# Patient Record
Sex: Female | Born: 1960 | Race: Black or African American | Hispanic: No | Smoking: Never smoker
Health system: Southern US, Community
[De-identification: ages and names within clinical notes are randomized; demographics above are authoritative.]

## PROBLEM LIST (undated history)

## (undated) DIAGNOSIS — I729 Aneurysm of unspecified site: Secondary | ICD-10-CM

## (undated) DIAGNOSIS — J45909 Unspecified asthma, uncomplicated: Secondary | ICD-10-CM

## (undated) DIAGNOSIS — F329 Major depressive disorder, single episode, unspecified: Secondary | ICD-10-CM

## (undated) DIAGNOSIS — F32A Depression, unspecified: Secondary | ICD-10-CM

## (undated) DIAGNOSIS — E7800 Pure hypercholesterolemia, unspecified: Secondary | ICD-10-CM

## (undated) DIAGNOSIS — I10 Essential (primary) hypertension: Secondary | ICD-10-CM

## (undated) DIAGNOSIS — E119 Type 2 diabetes mellitus without complications: Secondary | ICD-10-CM

## (undated) DIAGNOSIS — F4310 Post-traumatic stress disorder, unspecified: Secondary | ICD-10-CM

## (undated) DIAGNOSIS — I509 Heart failure, unspecified: Secondary | ICD-10-CM

## (undated) HISTORY — PX: SHOULDER SURGERY: SHX246

## (undated) HISTORY — PX: ABDOMINAL HYSTERECTOMY: SHX81

## (undated) HISTORY — PX: TYMPANOSTOMY TUBE PLACEMENT: SHX32

## (undated) HISTORY — PX: TONSILLECTOMY: SUR1361

## (undated) HISTORY — PX: KNEE SURGERY: SHX244

---

## 2013-01-05 ENCOUNTER — Emergency Department (HOSPITAL_BASED_OUTPATIENT_CLINIC_OR_DEPARTMENT_OTHER)
Admission: EM | Admit: 2013-01-05 | Discharge: 2013-01-05 | Disposition: A | Discharge status: Home / Self Care | Payer: Medicaid Other | Attending: Emergency Medicine | Admitting: Emergency Medicine

## 2013-01-05 ENCOUNTER — Emergency Department (HOSPITAL_BASED_OUTPATIENT_CLINIC_OR_DEPARTMENT_OTHER): Payer: Medicaid Other

## 2013-01-05 ENCOUNTER — Encounter (HOSPITAL_BASED_OUTPATIENT_CLINIC_OR_DEPARTMENT_OTHER): Payer: Self-pay | Admitting: *Deleted

## 2013-01-05 DIAGNOSIS — F3289 Other specified depressive episodes: Secondary | ICD-10-CM | POA: Insufficient documentation

## 2013-01-05 DIAGNOSIS — R51 Headache: Secondary | ICD-10-CM | POA: Insufficient documentation

## 2013-01-05 DIAGNOSIS — E7800 Pure hypercholesterolemia, unspecified: Secondary | ICD-10-CM | POA: Insufficient documentation

## 2013-01-05 DIAGNOSIS — R5383 Other fatigue: Secondary | ICD-10-CM | POA: Insufficient documentation

## 2013-01-05 DIAGNOSIS — I509 Heart failure, unspecified: Secondary | ICD-10-CM | POA: Insufficient documentation

## 2013-01-05 DIAGNOSIS — Z7982 Long term (current) use of aspirin: Secondary | ICD-10-CM | POA: Insufficient documentation

## 2013-01-05 DIAGNOSIS — R5381 Other malaise: Secondary | ICD-10-CM | POA: Insufficient documentation

## 2013-01-05 DIAGNOSIS — Z79899 Other long term (current) drug therapy: Secondary | ICD-10-CM | POA: Insufficient documentation

## 2013-01-05 DIAGNOSIS — F329 Major depressive disorder, single episode, unspecified: Secondary | ICD-10-CM | POA: Insufficient documentation

## 2013-01-05 DIAGNOSIS — R509 Fever, unspecified: Secondary | ICD-10-CM

## 2013-01-05 DIAGNOSIS — J45909 Unspecified asthma, uncomplicated: Secondary | ICD-10-CM | POA: Insufficient documentation

## 2013-01-05 DIAGNOSIS — I10 Essential (primary) hypertension: Secondary | ICD-10-CM | POA: Insufficient documentation

## 2013-01-05 HISTORY — DX: Heart failure, unspecified: I50.9

## 2013-01-05 HISTORY — DX: Depression, unspecified: F32.A

## 2013-01-05 HISTORY — DX: Major depressive disorder, single episode, unspecified: F32.9

## 2013-01-05 HISTORY — DX: Pure hypercholesterolemia, unspecified: E78.00

## 2013-01-05 HISTORY — DX: Unspecified asthma, uncomplicated: J45.909

## 2013-01-05 HISTORY — DX: Essential (primary) hypertension: I10

## 2013-01-05 LAB — CBC WITH DIFFERENTIAL/PLATELET
Eosinophils Relative: 2 % (ref 0–5)
HCT: 38.1 % (ref 36.0–46.0)
Hemoglobin: 13.1 g/dL (ref 12.0–15.0)
Lymphocytes Relative: 29 % (ref 12–46)
Lymphs Abs: 1.8 10*3/uL (ref 0.7–4.0)
MCV: 87.2 fL (ref 78.0–100.0)
Monocytes Absolute: 0.6 10*3/uL (ref 0.1–1.0)
Monocytes Relative: 9 % (ref 3–12)
Neutro Abs: 3.6 10*3/uL (ref 1.7–7.7)
RDW: 13.4 % (ref 11.5–15.5)
WBC: 6.1 10*3/uL (ref 4.0–10.5)

## 2013-01-05 LAB — COMPREHENSIVE METABOLIC PANEL
BUN: 3 mg/dL — ABNORMAL LOW (ref 6–23)
CO2: 29 mEq/L (ref 19–32)
Calcium: 9.9 mg/dL (ref 8.4–10.5)
Chloride: 101 mEq/L (ref 96–112)
Creatinine, Ser: 0.8 mg/dL (ref 0.50–1.10)
GFR calc Af Amer: >90 mL/min (ref >90)
GFR calc non Af Amer: 83 mL/min — ABNORMAL LOW (ref >90)
Glucose, Bld: 96 mg/dL (ref 70–99)
Total Bilirubin: 0.2 mg/dL — ABNORMAL LOW (ref 0.3–1.2)

## 2013-01-05 MED ORDER — DOXYCYCLINE HYCLATE 100 MG PO CAPS: 100.0000 mg | ORAL_CAPSULE | Freq: Two times a day (BID) | ORAL | Status: DC

## 2013-01-05 NOTE — ED Provider Notes (Signed)
History     CSN: [id number]  Arrival date & time 01/05/13  1512   First MD Initiated Contact with Patient 01/05/13 1620      Chief Complaint  Patient presents with  . Generalized Body Aches    (Consider location/radiation/quality/duration/timing/severity/associated sxs/prior treatment) Patient is a 52 y.o. female presenting with weakness. The history is provided by the patient.  Weakness This is a new problem. The current episode started 1 to 4 weeks ago. The problem occurs constantly. The problem has been gradually worsening. Associated symptoms include a fever, headaches, myalgias and weakness. Nothing aggravates the symptoms. She has tried nothing for the symptoms.   Pt reports she has had fevers on and off.  Pt has been treated with 2 antibiotics.  Pt reports she still feels bad and has continued to run fevers.   Past Medical History  Diagnosis Date  . CHF (congestive heart failure)   . Hypertension   . Asthma   . Hypercholesteremia   . Depression     Past Surgical History  Procedure Laterality Date  . Abdominal hysterectomy    . Knee surgery    . Shoulder surgery    . Tonsillectomy      History reviewed. No pertinent family history.  History  Substance Use Topics  . Smoking status: Never Smoker   . Smokeless tobacco: Not on file  . Alcohol Use: No    OB History   Grav Para Term Preterm Abortions TAB SAB Ect Mult Living                  Review of Systems  Constitutional: Positive for fever.  Musculoskeletal: Positive for myalgias.  Neurological: Positive for weakness and headaches.  All other systems reviewed and are negative.    Allergies  Review of patient's allergies indicates no known allergies.  Home Medications   Current Outpatient Rx  Name  Route  Sig  Dispense  Refill  . ALPRAZolam (XANAX) 0.25 MG tablet   Oral   Take 0.25 mg by mouth at bedtime as needed for sleep.         Marland Kitchen aspirin 81 MG tablet   Oral   Take 81 mg by mouth  daily.         . cyclobenzaprine (FLEXERIL) 10 MG tablet   Oral   Take 10 mg by mouth daily.         . metoprolol tartrate (LOPRESSOR) 25 MG tablet   Oral   Take by mouth.         . pravastatin (PRAVACHOL) 10 MG tablet   Oral   Take 10 mg by mouth daily.         . QUEtiapine (SEROQUEL) 100 MG tablet   Oral   Take 100 mg by mouth at bedtime.           BP 134/104  Pulse 90  Temp(Src) 98.1 F (36.7 C) (Oral)  Resp 20  Ht 5\' 2"  (1.575 m)  Wt 190 lb (86.183 kg)  BMI 34.74 kg/m2  SpO2 98%  Physical Exam  Nursing note and vitals reviewed. Constitutional: She is oriented to person, place, and time. She appears well-developed and well-nourished.  HENT:  Head: Normocephalic.  Right Ear: External ear normal.  Left Ear: External ear normal.  Eyes: Conjunctivae and EOM are normal. Pupils are equal, round, and reactive to light.  Neck: Normal range of motion.  Cardiovascular: Normal rate and regular rhythm.   Pulmonary/Chest: Effort normal and breath sounds  normal.  Abdominal: She exhibits no distension.  Musculoskeletal: Normal range of motion.  Neurological: She is alert and oriented to person, place, and time.  Skin: Skin is warm.  Psychiatric: She has a normal mood and affect.    ED Course  Procedures (including critical care time)  Labs Reviewed  CBC WITH DIFFERENTIAL - Abnormal; Notable for the following:    Platelets 528 (*)    All other components within normal limits  COMPREHENSIVE METABOLIC PANEL - Abnormal; Notable for the following:    BUN 3 (*)    Total Protein 8.6 (*)    Total Bilirubin 0.2 (*)    GFR calc non Af Amer 83 (*)    All other components within normal limits   Dg Chest 2 View  01/05/2013  *RADIOLOGY REPORT*  Clinical Data: Shortness of breath and weakness.  CHEST - 2 VIEW  Comparison: None  Findings: The cardiomediastinal silhouette is unremarkable. There is no evidence of focal airspace disease, pulmonary edema, suspicious pulmonary  nodule/mass, pleural effusion, or pneumothorax. No acute bony abnormalities are identified.  IMPRESSION: No evidence of active cardiopulmonary disease.   Original Report Authenticated By: Harmon Pier, M.D.      No diagnosis found.    MDM  Labs normal,Chest xray normal.   RMSF ordered.   I advised pt to see her MD for recheck on Monday.  I will treat with doxycycline due to headache and fever,  No menigeal signs       Elson Areas, [place] 01/05/13 1808  Medical screening examination/treatment/procedure(s) were performed by non-physician practitioner and as supervising physician I was immediately available for consultation/collaboration.  Derwood Kaplan, MD 01/08/13 [phone]

## 2013-01-05 NOTE — ED Notes (Signed)
Pt states she has been having s/s x 3 weeks. Seen at [facility]. Dx'd with strep and bilat ear inf and given "Amox 875 x 10 days" On 6th day, began having s/s again and went back. Given 250 mg more as well as Tramadol, but no better.

## 2016-10-09 ENCOUNTER — Encounter (HOSPITAL_BASED_OUTPATIENT_CLINIC_OR_DEPARTMENT_OTHER): Payer: Self-pay | Admitting: Emergency Medicine

## 2016-10-09 ENCOUNTER — Emergency Department (HOSPITAL_BASED_OUTPATIENT_CLINIC_OR_DEPARTMENT_OTHER)
Admission: EM | Admit: 2016-10-09 | Discharge: 2016-10-09 | Disposition: A | Discharge status: Home / Self Care | Payer: Medicaid Other | Attending: Emergency Medicine | Admitting: Emergency Medicine

## 2016-10-09 DIAGNOSIS — K047 Periapical abscess without sinus: Secondary | ICD-10-CM

## 2016-10-09 DIAGNOSIS — H9201 Otalgia, right ear: Secondary | ICD-10-CM | POA: Diagnosis present

## 2016-10-09 DIAGNOSIS — I509 Heart failure, unspecified: Secondary | ICD-10-CM | POA: Diagnosis not present

## 2016-10-09 DIAGNOSIS — Z79899 Other long term (current) drug therapy: Secondary | ICD-10-CM | POA: Insufficient documentation

## 2016-10-09 DIAGNOSIS — J45909 Unspecified asthma, uncomplicated: Secondary | ICD-10-CM | POA: Diagnosis not present

## 2016-10-09 DIAGNOSIS — I110 Hypertensive heart disease with heart failure: Secondary | ICD-10-CM | POA: Insufficient documentation

## 2016-10-09 DIAGNOSIS — Z7982 Long term (current) use of aspirin: Secondary | ICD-10-CM | POA: Diagnosis not present

## 2016-10-09 MED ORDER — PENICILLIN V POTASSIUM 500 MG PO TABS: 500.0000 mg | ORAL_TABLET | Freq: Four times a day (QID) | ORAL | 0 refills | Status: AC

## 2016-10-09 MED ORDER — NAPROXEN 500 MG PO TABS: 500.0000 mg | ORAL_TABLET | Freq: Two times a day (BID) | ORAL | 0 refills | Status: DC

## 2016-10-09 NOTE — Discharge Instructions (Signed)
Return to the ED with any concerns including difficulty breathing or swallowing, vomiting and not able to keep down liquids or medications or any other alarming symptoms

## 2016-10-09 NOTE — ED Triage Notes (Signed)
Treated for ear infection x 10 days ago with poly mix ear gtts and pain worse and also has dental pain. Took Motrin 400mg  PTA

## 2016-10-09 NOTE — ED Provider Notes (Signed)
[facility] Provider [facility]   CSN: [id number] Arrival date & time: 10/09/16  1103     History   Chief Complaint Chief Complaint  Patient presents with  . Otalgia    HPI Sherri Graham is a 56 y.o. female.  HPI  Pt presenting with c/o pain in right ear and right side of mouth and right posterior jaw, pt was treated with abx drops and zpack finished approx 1 week ago for right sided ear infection- she states her tooth and jaw were not hurting at that time.  Then 3 days ago the pain began in her right jaw and behind her ear and in her right lower mouth.  No fever/chills.  No difficulty breathing or swallowing.    Past Medical History:  Diagnosis Date  . Asthma   . CHF (congestive heart failure) (HCC)   . Depression   . Hypercholesteremia   . Hypertension     There are no active problems to display for this patient.   Past Surgical History:  Procedure Laterality Date  . ABDOMINAL HYSTERECTOMY    . KNEE SURGERY    . SHOULDER SURGERY    . TONSILLECTOMY    . TYMPANOSTOMY TUBE PLACEMENT      OB History    No data available       Home Medications    Prior to Admission medications   Medication Sig Start Date End Date Taking? Authorizing Provider  ALPRAZolam Prudy Feeler) 0.25 MG tablet Take 0.25 mg by mouth at bedtime as needed for sleep.   Yes Historical Provider, MD  aspirin 81 MG tablet Take 81 mg by mouth daily.   Yes Historical Provider, MD  cyclobenzaprine (FLEXERIL) 10 MG tablet Take 10 mg by mouth daily.   Yes Historical Provider, MD  metoprolol tartrate (LOPRESSOR) 25 MG tablet Take by mouth.   Yes Historical Provider, MD  pravastatin (PRAVACHOL) 10 MG tablet Take 10 mg by mouth daily.   Yes Historical Provider, MD  QUEtiapine (SEROQUEL) 100 MG tablet Take 100 mg by mouth at bedtime.   Yes Historical Provider, MD  doxycycline (VIBRAMYCIN) 100 MG capsule Take 1 capsule (100 mg total) by mouth 2 (two) times daily. 01/05/13   Elson Areas, PA-C  naproxen  (NAPROSYN) 500 MG tablet Take 1 tablet (500 mg total) by mouth 2 (two) times daily. 10/09/16   Jerelyn Scott, MD  penicillin v potassium (VEETID) 500 MG tablet Take 1 tablet (500 mg total) by mouth 4 (four) times daily. 10/09/16 10/19/16  Jerelyn Scott, MD    Family History No family history on file.  Social History Social History  Substance Use Topics  . Smoking status: Never Smoker  . Smokeless tobacco: Never Used  . Alcohol use No     Allergies   Patient has no known allergies.   Review of Systems Review of Systems  ROS reviewed and all otherwise negative except for mentioned in HPI   Physical Exam Updated Vital Signs BP 130/95 (BP Location: Left Arm)   Pulse 78   Temp 98.3 F (36.8 C) (Oral)   Resp 20   Ht 5\' 3"  (1.6 m)   Wt 180 lb (81.6 kg)   SpO2 100%   BMI 31.89 kg/m  Vitals reviewed Physical Exam Physical Examination: General appearance - alert, well appearing, and in no distress Mental status - alert, oriented to person, place, and time Eyes -no conjunctival injection, no scleral icterus Ears - bilateral external ear canals normal, right TM normal, left  TM with scarring Mouth - mucous membranes moist, pharynx normal without lesions, dental carie in right lower molar, no gingival abscesss, ttp over angle of mandible, tender posterior auricular lymphadenopathy Neck - supple, tender posterior auricular lymphadenopathy Chest - clear to auscultation, no wheezes, rales or rhonchi, symmetric air entry Heart - normal rate, regular rhythm, normal S1, S2, no murmurs, rubs, clicks or gallops Extremities - peripheral pulses normal, no pedal edema, no clubbing or cyanosis Skin - normal coloration and turgor, no rashes  ED Treatments / Results  Labs (all labs ordered are listed, but only abnormal results are displayed) Labs Reviewed - No data to display  EKG  EKG Interpretation None       Radiology No results found.  Procedures Procedures (including critical  care time)  Medications Ordered in ED Medications - No data to display   Initial Impression / Assessment and Plan / ED Course  I have reviewed the triage vital signs and the nursing notes.  Pertinent labs & imaging results that were available during my care of the patient were reviewed by me and considered in my medical decision making (see chart for details).     Pt presenting with pain behind right jaw and right ear, her TM and EAC are normal, some tender LAD behind ear and in cervical chain- she has some dental caries- no obvious abscess. No sore throat, doubt RP abcess or PTA. Suspect source of pain may be coming from dental infection.  Pt started on penicillin. Given rx for naproxen.  Advised to call dentist tomorrow.  Discharged with strict return precautions.  Pt agreeable with plan.  Final Clinical Impressions(s) / ED Diagnoses   Final diagnoses:  Dental infection    New Prescriptions Discharge Medication List as of 10/09/2016  1:22 PM    START taking these medications   Details  naproxen (NAPROSYN) 500 MG tablet Take 1 tablet (500 mg total) by mouth 2 (two) times daily., Starting Sun 10/09/2016, Print    penicillin v potassium (VEETID) 500 MG tablet Take 1 tablet (500 mg total) by mouth 4 (four) times daily., Starting Sun 10/09/2016, Until Wed 10/19/2016, Print         Jerelyn Scott, MD 10/09/16 [phone]

## 2016-11-21 ENCOUNTER — Emergency Department (HOSPITAL_BASED_OUTPATIENT_CLINIC_OR_DEPARTMENT_OTHER): Payer: Medicaid Other

## 2016-11-21 ENCOUNTER — Encounter (HOSPITAL_BASED_OUTPATIENT_CLINIC_OR_DEPARTMENT_OTHER): Payer: Self-pay

## 2016-11-21 ENCOUNTER — Inpatient Hospital Stay (HOSPITAL_BASED_OUTPATIENT_CLINIC_OR_DEPARTMENT_OTHER)
Admission: EM | Admit: 2016-11-21 | Discharge: 2016-11-22 | DRG: 303 | Disposition: A | Discharge status: Home / Self Care | Payer: Medicaid Other | Attending: Cardiology | Admitting: Cardiology

## 2016-11-21 DIAGNOSIS — J45909 Unspecified asthma, uncomplicated: Secondary | ICD-10-CM | POA: Diagnosis present

## 2016-11-21 DIAGNOSIS — I509 Heart failure, unspecified: Secondary | ICD-10-CM | POA: Diagnosis present

## 2016-11-21 DIAGNOSIS — R072 Precordial pain: Secondary | ICD-10-CM

## 2016-11-21 DIAGNOSIS — I10 Essential (primary) hypertension: Secondary | ICD-10-CM | POA: Diagnosis not present

## 2016-11-21 DIAGNOSIS — I252 Old myocardial infarction: Secondary | ICD-10-CM

## 2016-11-21 DIAGNOSIS — R7989 Other specified abnormal findings of blood chemistry: Secondary | ICD-10-CM | POA: Diagnosis present

## 2016-11-21 DIAGNOSIS — I429 Cardiomyopathy, unspecified: Secondary | ICD-10-CM | POA: Diagnosis present

## 2016-11-21 DIAGNOSIS — Z79899 Other long term (current) drug therapy: Secondary | ICD-10-CM | POA: Diagnosis not present

## 2016-11-21 DIAGNOSIS — R0789 Other chest pain: Secondary | ICD-10-CM | POA: Diagnosis not present

## 2016-11-21 DIAGNOSIS — R079 Chest pain, unspecified: Secondary | ICD-10-CM

## 2016-11-21 DIAGNOSIS — Z7982 Long term (current) use of aspirin: Secondary | ICD-10-CM

## 2016-11-21 DIAGNOSIS — F319 Bipolar disorder, unspecified: Secondary | ICD-10-CM | POA: Diagnosis present

## 2016-11-21 DIAGNOSIS — I110 Hypertensive heart disease with heart failure: Secondary | ICD-10-CM | POA: Diagnosis present

## 2016-11-21 DIAGNOSIS — Z880 Allergy status to penicillin: Secondary | ICD-10-CM

## 2016-11-21 DIAGNOSIS — I214 Non-ST elevation (NSTEMI) myocardial infarction: Secondary | ICD-10-CM | POA: Insufficient documentation

## 2016-11-21 DIAGNOSIS — I2510 Atherosclerotic heart disease of native coronary artery without angina pectoris: Secondary | ICD-10-CM | POA: Diagnosis not present

## 2016-11-21 DIAGNOSIS — E785 Hyperlipidemia, unspecified: Secondary | ICD-10-CM | POA: Diagnosis not present

## 2016-11-21 DIAGNOSIS — R778 Other specified abnormalities of plasma proteins: Secondary | ICD-10-CM

## 2016-11-21 DIAGNOSIS — I25119 Atherosclerotic heart disease of native coronary artery with unspecified angina pectoris: Secondary | ICD-10-CM | POA: Diagnosis not present

## 2016-11-21 LAB — CBC WITH DIFFERENTIAL/PLATELET
Basophils Absolute: 0 10*3/uL (ref 0.0–0.1)
Basophils Relative: 0 %
Eosinophils Absolute: 0 10*3/uL (ref 0.0–0.7)
Eosinophils Relative: 0 %
HEMATOCRIT: 41 % (ref 36.0–46.0)
HEMOGLOBIN: 13.9 g/dL (ref 12.0–15.0)
LYMPHS ABS: 1.9 10*3/uL (ref 0.7–4.0)
Lymphocytes Relative: 39 %
MCH: 29.5 pg (ref 26.0–34.0)
MCHC: 33.9 g/dL (ref 30.0–36.0)
MCV: 87 fL (ref 78.0–100.0)
MONO ABS: 0.6 10*3/uL (ref 0.1–1.0)
Monocytes Relative: 12 %
Neutro Abs: 2.3 10*3/uL (ref 1.7–7.7)
Neutrophils Relative %: 49 %
PLATELETS: 311 10*3/uL (ref 150–400)
RBC: 4.71 MIL/uL (ref 3.87–5.11)
RDW: 14.9 % (ref 11.5–15.5)
WBC: 4.8 10*3/uL (ref 4.0–10.5)

## 2016-11-21 LAB — COMPREHENSIVE METABOLIC PANEL
ALT: 12 U/L — ABNORMAL LOW (ref 14–54)
ANION GAP: 11 (ref 5–15)
AST: 16 U/L (ref 15–41)
Albumin: 4.1 g/dL (ref 3.5–5.0)
Alkaline Phosphatase: 98 U/L (ref 38–126)
BUN: 10 mg/dL (ref 6–20)
CO2: 26 mmol/L (ref 22–32)
Calcium: 8.9 mg/dL (ref 8.9–10.3)
Chloride: 101 mmol/L (ref 101–111)
Creatinine, Ser: 0.87 mg/dL (ref 0.44–1.00)
GFR calc Af Amer: >60 mL/min (ref >60)
GFR calc non Af Amer: >60 mL/min (ref >60)
GLUCOSE: 104 mg/dL — AB (ref 65–99)
POTASSIUM: 3.3 mmol/L — AB (ref 3.5–5.1)
Sodium: 138 mmol/L (ref 135–145)
TOTAL PROTEIN: 7.8 g/dL (ref 6.5–8.1)
Total Bilirubin: 0.5 mg/dL (ref 0.3–1.2)

## 2016-11-21 LAB — PROTIME-INR
INR: 0.98
Prothrombin Time: 13 seconds (ref 11.4–15.2)

## 2016-11-21 LAB — HEPARIN LEVEL (UNFRACTIONATED): Heparin Unfractionated: 1.03 IU/mL — ABNORMAL HIGH (ref 0.30–0.70)

## 2016-11-21 LAB — TROPONIN I
TROPONIN I: 0.05 ng/mL — AB (ref <0.03)
Troponin I: <0.03 ng/mL (ref <0.03)

## 2016-11-21 LAB — APTT: aPTT: 29 seconds (ref 24–36)

## 2016-11-21 LAB — D-DIMER, QUANTITATIVE: D-Dimer, Quant: <0.27 ug/mL-FEU (ref 0.00–0.50)

## 2016-11-21 MED ORDER — FENTANYL CITRATE (PF) 100 MCG/2ML IJ SOLN
INTRAMUSCULAR | Status: AC
  Filled 2016-11-21: qty 2

## 2016-11-21 MED ORDER — ALPRAZOLAM 0.25 MG PO TABS: 0.2500 mg | ORAL_TABLET | Freq: Every evening | ORAL | Status: DC | PRN

## 2016-11-21 MED ORDER — NITROGLYCERIN 2 % TD OINT
1.0000 [in_us] | TOPICAL_OINTMENT | Freq: Once | TRANSDERMAL | Status: AC
  Administered 2016-11-21: 1 [in_us] via TOPICAL
  Filled 2016-11-21: qty 1

## 2016-11-21 MED ORDER — HEPARIN (PORCINE) IN NACL 100-0.45 UNIT/ML-% IJ SOLN: 700.0000 [IU]/h | INTRAMUSCULAR | Status: DC

## 2016-11-21 MED ORDER — FENTANYL CITRATE (PF) 100 MCG/2ML IJ SOLN
50.0000 ug | Freq: Once | INTRAMUSCULAR | Status: AC
  Administered 2016-11-21: 50 ug via INTRAVENOUS

## 2016-11-21 MED ORDER — HEPARIN BOLUS VIA INFUSION
4000.0000 [IU] | Freq: Once | INTRAVENOUS | Status: AC
  Administered 2016-11-21: 4000 [IU] via INTRAVENOUS

## 2016-11-21 MED ORDER — PRAVASTATIN SODIUM 20 MG PO TABS
10.0000 mg | ORAL_TABLET | Freq: Every day | ORAL | Status: DC
  Administered 2016-11-21 – 2016-11-22 (×2): 10 mg via ORAL
  Filled 2016-11-21 (×2): qty 1

## 2016-11-21 MED ORDER — QUETIAPINE FUMARATE 50 MG PO TABS
100.0000 mg | ORAL_TABLET | Freq: Every day | ORAL | Status: DC
  Administered 2016-11-21: 100 mg via ORAL
  Filled 2016-11-21: qty 2

## 2016-11-21 MED ORDER — KETOROLAC TROMETHAMINE 30 MG/ML IJ SOLN
30.0000 mg | Freq: Once | INTRAMUSCULAR | Status: AC
  Administered 2016-11-21: 30 mg via INTRAVENOUS
  Filled 2016-11-21: qty 1

## 2016-11-21 MED ORDER — ASPIRIN EC 81 MG PO TBEC
81.0000 mg | DELAYED_RELEASE_TABLET | Freq: Every day | ORAL | Status: DC
  Administered 2016-11-22: 81 mg via ORAL
  Filled 2016-11-21: qty 1

## 2016-11-21 MED ORDER — HEPARIN (PORCINE) IN NACL 100-0.45 UNIT/ML-% IJ SOLN
850.0000 [IU]/h | INTRAMUSCULAR | Status: DC
  Administered 2016-11-21: 850 [IU]/h via INTRAVENOUS
  Filled 2016-11-21: qty 250

## 2016-11-21 MED ORDER — METOPROLOL TARTRATE 25 MG PO TABS
25.0000 mg | ORAL_TABLET | Freq: Two times a day (BID) | ORAL | Status: DC
  Administered 2016-11-21 – 2016-11-22 (×2): 25 mg via ORAL
  Filled 2016-11-21 (×2): qty 1

## 2016-11-21 MED ORDER — ASPIRIN 81 MG PO CHEW
324.0000 mg | CHEWABLE_TABLET | Freq: Once | ORAL | Status: AC
  Administered 2016-11-21: 324 mg via ORAL
  Filled 2016-11-21: qty 4

## 2016-11-21 NOTE — Progress Notes (Signed)
ANTICOAGULATION CONSULT NOTE  Pharmacy Consult for heparin Indication: chest pain/ACS  Heparin Dosing Weight: 69.4 kg   Assessment: 55 yof presenting with CP. Pharmacy consulted to start heparin. Not on anticoagulation PTA. CBC wnl, no bleed documented.  Goal of Therapy:  Heparin level 0.3-0.7 units/ml Monitor platelets by anticoagulation protocol: Yes   Plan:  Heparin 4000 unit bolus Start heparin at 850 units/h 6h heparin level Daily heparin level/CBC Monitor s/sx bleeding   Sherri Graham, PharmD, BCPS Clinical Pharmacist 11/21/2016 2:33 PM

## 2016-11-21 NOTE — Progress Notes (Signed)
ANTICOAGULATION CONSULT NOTE - Follow Up Consult  Pharmacy Consult for heparin Indication: chest pain/ACS  Labs:  Recent Labs  11/21/16 1230 11/21/16 1301 11/21/16 1330 11/21/16 1331 11/21/16 1849 11/21/16 2215  HGB  --  13.9  --   --   --   --   HCT  --  41.0  --   --   --   --   PLT  --  311  --   --   --   --   APTT  --   --   --  29  --   --   LABPROT  --   --   --  13.0  --   --   INR  --   --   --  0.98  --   --   HEPARINUNFRC  --   --   --   --   --  1.03*  CREATININE  --   --  0.87  --   --   --   TROPONINI 0.05*  --   --   --  <0.03  --     Assessment: 56yo female above goal on heparin with initial dosing for CP.  Goal of Therapy:  Heparin level 0.3-0.7 units/ml   Plan:  Will hold heparin gtt x44min then resume at 3 units/kg/hr lower rate of 650 units/hr and check level with closest lab draw ~8hr after resumed.  Vernard Gambles, PharmD, BCPS  11/21/2016,11:25 PM

## 2016-11-21 NOTE — H&P (Signed)
Patient ID: Sherri Graham MRN: [id number], DOB/AGE: 05-22-1961   Admit date: 11/21/2016   Primary Physician: Ferman Hamming, MD Primary Cardiologist: New  Pt. Profile:  56 y/o AA female witih h/o HTN, HLD, asthma, bipolar disorder and reported h/o CHF transferred from [facility] for chest pain evaluation.   Problem List  Past Medical History:  Diagnosis Date  . Asthma   . CHF (congestive heart failure) (HCC)   . Depression   . Hypercholesteremia   . Hypertension     Past Surgical History:  Procedure Laterality Date  . ABDOMINAL HYSTERECTOMY    . KNEE SURGERY    . SHOULDER SURGERY    . TONSILLECTOMY    . TYMPANOSTOMY TUBE PLACEMENT       Allergies  Allergies  Allergen Reactions  . Penicillins     hives    HPI  56 y/o AA female witih h/o HTN, HLD, asthma and reported h/o CHF transferred from [facility] for chest pain evaluation.   She is a poor historian. Per records, she has bipolar disorder and is followed by behavioral medicine and is on multiple psych meds. She was seen at outpatient behavioral health earlier today.   She reports that she has been followed in the past by a cardiologist in [place], Dr. Deno Etienne. She admits that she has not seen him in over 1 year. 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 She reports that she had a cath before in [place]. She is unsure of the exact details but states that she did not get a stent. She is on meds for HTN and HLD and reports full compliance. She denies tobacco use. No h/o DM.  She was in her usual state of health until 3-4 days ago. She developed resting SSCP described as chest tightness/ pressure. Occurred off and on. Also with mild associated dyspnea. She tried to stay in bed most of the day when it first started and avoided exertion. However, given her recurrent pain, she went to her PCP. She was initially felt to have an acute asthma flare and was given steroids and antibiotics,  however no improvement. This prompted her to seek urgent care in [place].    At [facility], initial troponin was abnormal at 0.05. D-dimer negative. EKG shows sinus tach with rate of 106 bpm. K 3.3. Renal function normal with Scr at 0.87. CBC unremarkable. BP has been elevated in the 90s-low 100s diastolic. Nitro patch was placed. Pain improved.   Home Medications  Prior to Admission medications   Medication Sig Start Date End Date Taking? Authorizing Provider  ALPRAZolam Prudy Feeler) 0.25 MG tablet Take 0.25 mg by mouth at bedtime as needed for sleep.   Yes Historical Provider, MD  aspirin 81 MG tablet Take 81 mg by mouth daily.   Yes Historical Provider, MD  cyclobenzaprine (FLEXERIL) 10 MG tablet Take 10 mg by mouth daily.   Yes Historical Provider, MD  metoprolol tartrate (LOPRESSOR) 25 MG tablet Take by mouth.   Yes Historical Provider, MD  pravastatin (PRAVACHOL) 10 MG tablet Take 10 mg by mouth daily.   Yes Historical Provider, MD  QUEtiapine (SEROQUEL) 100 MG tablet Take 100 mg by mouth at bedtime.   Yes Historical Provider, MD  doxycycline (VIBRAMYCIN) 100 MG capsule Take 1 capsule (100 mg total) by mouth 2 (two) times daily. 01/05/13   Elson Areas, PA-C  naproxen (NAPROSYN) 500 MG tablet Take 1 tablet (500  mg total) by mouth 2 (two) times daily. 10/09/16   Jerelyn Scott, MD    Family History  Family History  Problem Relation Age of Onset  . Hypertension Mother     Social History  Social History   Social History  . Marital status: Widowed    Spouse name: N/A  . Number of children: N/A  . Years of education: N/A   Occupational History  . Not on file.   Social History Main Topics  . Smoking status: Never Smoker  . Smokeless tobacco: Never Used  . Alcohol use No  . Drug use: No  . Sexual activity: Not on file   Other Topics Concern  . Not on file   Social History Narrative  . No narrative on file     Review of Systems General:  No chills, fever, night  sweats or weight changes.  Cardiovascular: + chest pain, + dyspnea on exertion, no edema, orthopnea, palpitations, paroxysmal nocturnal dyspnea. Dermatological: No rash, lesions/masses Respiratory: No cough, dyspnea Urologic: No hematuria, dysuria Abdominal:   No nausea, vomiting, diarrhea, bright red blood per rectum, melena, or hematemesis Neurologic:  No visual changes, wkns, changes in mental status. All other systems reviewed and are otherwise negative except as noted above.  Physical Exam  Blood pressure (!) 109/97, pulse 74, temperature 98.4 F (36.9 C), temperature source Oral, resp. rate 16, height 5\' 2"  (1.575 m), weight 85.3 kg (188 lb), SpO2 99 %.  General: Pleasant, NAD Psych: Normal affect. Neuro: Alert and oriented X 3. Moves all extremities spontaneously. HEENT: : [place]/AT, EOMI, MMM, anicteric sclera Neck: Supple without bruits or JVD. Lungs:  Resp regular and unlabored, CTA. Heart: RRR no s3, s4, or murmurs. - focal tenderness bilaterally along the sternal margin (replicates her pain). Abdomen: Soft, non-tender, non-distended, BS + x 4.  Extremities: No clubbing, cyanosis or edema. DP/PT/Radials 2+ and equal bilaterally. Skin: no obvious rash or lesions. No edema  Labs  Troponin (Point of Care Test) No results for input(s): TROPIPOC in the last 72 hours.  Recent Labs  11/21/16 1230 11/21/16 1849  TROPONINI 0.05* <0.03   Lab Results  Component Value Date   WBC 4.8 11/21/2016   HGB 13.9 11/21/2016   HCT 41.0 11/21/2016   MCV 87.0 11/21/2016   PLT 311 11/21/2016     Recent Labs Lab 11/21/16 1330  NA 138  K 3.3*  CL 101  CO2 26  BUN 10  CREATININE 0.87  CALCIUM 8.9  PROT 7.8  BILITOT 0.5  ALKPHOS 98  ALT 12*  AST 16  GLUCOSE 104*   No results found for: CHOL, HDL, LDLCALC, TRIG Lab Results  Component Value Date   DDIMER <0.27 11/21/2016     Radiology/Studies  Dg Chest 2 View  Result Date: 11/21/2016 CLINICAL DATA:  Cough and chest pain  for 5 days. EXAM: CHEST  2 VIEW COMPARISON:  PA and lateral chest 09/13/2015 and 01/05/2013. FINDINGS: The lungs are clear. Heart size is normal. There is no pneumothorax or pleural effusion. No bony abnormality. IMPRESSION: Negative chest. Electronically Signed   By: Drusilla Kanner M.D.   On: 11/21/2016 12:25    ECG  Sinus tach 106 bpm. RAE -- personally reviewed   ASSESSMENT AND PLAN  1. Chest Pain with + Troponin: Pt is currently CP free. She notes cardiac history which includes h/o MI and CHF, treated at [facility] over 10 year ago. She notes prior cath but did not get a stent. Unfortunately, no  cath report or CAD anatomy is outlined in records. Initial troponin abnormal at 0.05. EKG shows mild sinus tach at 106 bpm. D-dimer negative, ruling out PE. We will plan to continue to cycle cardiac enzymes x 3. Continue IV heparin. NPO at midnight. Plan for ischemic evaluation in the am, either NST or LHC, depending on enzyme trend. If flat low level trend, plan stress test and 2D echo.   2. Bipolar Disorder: continue outpatient meds.   3. HLD: pt on pravastatin as an outpatient. Check FLP in the am.   4. HTN: Diastolic BPs have been elevated. Continue home metoprolol and continue to monitor. She may require a 2nd agent for better control.    SignedBryan Lemma, PA-C 11/21/2016, 8:37 PM   I have seen, examined and evaluated the patient this PM along with Boyce Medici, PA-C.  After reviewing all the available data and chart, we discussed the patients laboratory, study & physical findings as well as symptoms in detail. I agree with her findings, examination as well as impression recommendations as per our discussion.    Patient has a very confusing potential history. She did have evidence of troponin elevation several years ago, and was told that.stent. Something about an artery branching off. My suspicion is that it was probably that it was probably fed by collaterals. She does not  recall ever being told that she had a reduced pump function, although she was told she had congestive heart failure. This does not help much. -- We will need to get records from [facility] where she was seen by cardiologist there. On my exam the most notable thing was that the discomfort in her chest was somewhat reproducible on exam along both sternal margins. Probably more concerning for musculoskeletal costochondritis pain in the setting of recent bronchitis exacerbation.  The chest discomfort she is presenting with now even though there is a mild troponin elevation it sounds very atypical and most consistent with musculoskeletal chest pain. Given her history is at least moderate risk and therefore think is reasonable to consider stress test tomorrow.  We'll continue to cycle cardiac enzymes and use heparin until we see that there is indeed no evidence of MI. An echo will also help [place] evaluate her "heart failure" diagnosis.  - BB & Statin.  Continue Seroquel. Check A1c    Bryan Lemma, M.D., M.S. Interventional Cardiologist   Pager # [phone] Phone # [phone] [address] [place], [place] [address]

## 2016-11-21 NOTE — ED Triage Notes (Addendum)
PT reports that last Wednesday she was placed on Zpak, Steroids for asthma flare. Reports worsening of symptoms, and chest pain that worsened Saturday night, denies fever. Reports symptoms are exacerbating her CHF. Has completed Zpak. Continues PRN Tessalon.

## 2016-11-21 NOTE — ED Notes (Signed)
Watching TV, pleasant. Given lunch tray and sprite. States," I am feeling better"

## 2016-11-21 NOTE — ED Provider Notes (Signed)
Emergency Department Provider Note   I have reviewed the triage vital signs and the nursing notes.   HISTORY  Chief Complaint Chest Pain   HPI Sherri Graham is a 56 y.o. female with PMH of CHF, HLD, HTN resents to the emergency department for evaluation of chest discomfort for the past 2 days. Patient states that she's had coronary artery disease in the past but no recent heart catheterization or stress test. Several years ago she followed with a cardiologist in [place] but has not recently. Over the past 2 days she's had intermittent central chest pressure that is nonradiating. She has some very mild dyspnea. Symptoms are worse with inspiration but otherwise are no modifying factors. Her symptoms became more constant over the past 12 hours and so she presented to the emergency department. At this point she is having mild chest discomfort. She has been compliant with her medications. No recent infection symptoms.   Past Medical History:  Diagnosis Date  . Asthma   . CHF (congestive heart failure) (HCC)   . Depression   . Hypercholesteremia   . Hypertension     Patient Active Problem List   Diagnosis Date Noted  . NSTEMI (non-ST elevated myocardial infarction) (HCC) 11/21/2016  . Chest pain with moderate risk for cardiac etiology 11/21/2016    Past Surgical History:  Procedure Laterality Date  . ABDOMINAL HYSTERECTOMY    . KNEE SURGERY    . SHOULDER SURGERY    . TONSILLECTOMY    . TYMPANOSTOMY TUBE PLACEMENT        Allergies Penicillins  Family History  Problem Relation Age of Onset  . Hypertension Mother     Social History Social History  Substance Use Topics  . Smoking status: Never Smoker  . Smokeless tobacco: Never Used  . Alcohol use No    Review of Systems  Constitutional: No fever/chills Eyes: No visual changes. ENT: No sore throat. Cardiovascular: Positive chest pain. Respiratory: Denies shortness of breath. Gastrointestinal: No abdominal  pain.  No nausea, no vomiting.  No diarrhea.  No constipation. Genitourinary: Negative for dysuria. Musculoskeletal: Negative for back pain. Skin: Negative for rash. Neurological: Negative for headaches, focal weakness or numbness.  10-point ROS otherwise negative.  ____________________________________________   PHYSICAL EXAM:  VITAL SIGNS: ED Triage Vitals  Enc Vitals Group     BP 11/21/16 1146 115/65     Pulse Rate 11/21/16 1146 (!) 106     Resp 11/21/16 1146 18     Temp 11/21/16 1146 97.6 F (36.4 C)     Temp Source 11/21/16 1146 Oral     SpO2 11/21/16 1146 100 %     Weight 11/21/16 1146 188 lb (85.3 kg)     Height 11/21/16 1146 5\' 2"  (1.575 m)     Pain Score 11/21/16 1142 9   Constitutional: Alert and oriented. Well appearing and in no acute distress. Eyes: Conjunctivae are normal. Head: Atraumatic. Nose: No congestion/rhinnorhea. Mouth/Throat: Mucous membranes are moist.  Neck: No stridor.   Cardiovascular: Sinus tachycardia. Good peripheral circulation. Grossly normal heart sounds.   Respiratory: Normal respiratory effort.  No retractions. Lungs CTAB. Gastrointestinal: Soft and nontender. No distention.  Musculoskeletal: No lower extremity tenderness nor edema. No gross deformities of extremities. Neurologic:  Normal speech and language. No gross focal neurologic deficits are appreciated.  Skin:  Skin is warm, dry and intact. No rash noted.   ____________________________________________   LABS (all labs ordered are listed, but only abnormal results are displayed)  Labs  Reviewed  TROPONIN I - Abnormal; Notable for the following:       Result Value   Troponin I 0.05 (*)    All other components within normal limits  COMPREHENSIVE METABOLIC PANEL - Abnormal; Notable for the following:    Potassium 3.3 (*)    Glucose, Bld 104 (*)    ALT 12 (*)    All other components within normal limits  LIPID PANEL - Abnormal; Notable for the following:    HDL 40 (*)     All other components within normal limits  HEPARIN LEVEL (UNFRACTIONATED) - Abnormal; Notable for the following:    Heparin Unfractionated 1.03 (*)    All other components within normal limits  CBC WITH DIFFERENTIAL/PLATELET  D-DIMER, QUANTITATIVE (NOT AT [facility])  PROTIME-INR  APTT  TROPONIN I  TROPONIN I  CBC WITH DIFFERENTIAL/PLATELET  CBC  TROPONIN I  BASIC METABOLIC PANEL  CBC  HEPARIN LEVEL (UNFRACTIONATED)   ____________________________________________  EKG   EKG Interpretation  Date/Time:  Monday November 21 2016 11:47:36 EDT Ventricular Rate:  106 PR Interval:  116 QRS Duration: 104 QT Interval:  354 QTC Calculation: 470 R Axis:   78 Text Interpretation:  Sinus tachycardia Right atrial enlargement Nonspecific ST and T wave abnormality Abnormal ECG No STEMI. No prior for comparison.  Confirmed by Thoams Siefert MD, Ijanae Macapagal [phone]) on 11/21/2016 12:10:10 PM       ____________________________________________  RADIOLOGY  Dg Chest 2 View  Result Date: 11/21/2016 CLINICAL DATA:  Cough and chest pain for 5 days. EXAM: CHEST  2 VIEW COMPARISON:  PA and lateral chest 09/13/2015 and 01/05/2013. FINDINGS: The lungs are clear. Heart size is normal. There is no pneumothorax or pleural effusion. No bony abnormality. IMPRESSION: Negative chest. Electronically Signed   By: Drusilla Kanner M.D.   On: 11/21/2016 12:25    ____________________________________________   PROCEDURES  Procedure(s) performed:   Procedures  None ____________________________________________   INITIAL IMPRESSION / ASSESSMENT AND PLAN / ED COURSE  Pertinent labs & imaging results that were available during my care of the patient were reviewed by me and considered in my medical decision making (see chart for details).  Patient with past medical history of coronary artery disease, hyperlipidemia, CHF presents with chest pain over the past 2 days is become more constant over the past 12 hours. Her chest pain is  described as a pressure tightness in the center of her chest no modifying factors. Her initial labs from triage are significant for mild elevation in her troponin. D-dimer obtained with some possible pleuritic quality to the pain and tachycardia on arrival but this is normal. Chest x-ray is unremarkable. She does not follow with a cardiologist regularly. She reports a heart catheterization in 2013 but no stents were placed. No stress test in the past 3-4 years. Than her multiple medical comorbidities, chest pain, elevated troponin of plan for observational admission for further biomarker trending and consideration of provocative testing. We will discuss the case with cardiology given the elevated troponin.   Discussed patient's case with Cardiology, Dr. [place].  Recommend admission to obs, telemetry bed.  I will place holding orders per their request. Patient and family (if present) updated with plan. Care transferred to Cardiology service.  I reviewed all nursing notes, vitals, pertinent old records, EKGs, labs, imaging (as available).  ____________________________________________  FINAL CLINICAL IMPRESSION(S) / ED DIAGNOSES  Final diagnoses:  Precordial chest pain  Elevated troponin     MEDICATIONS GIVEN DURING THIS VISIT:  Medications  ALPRAZolam (XANAX) tablet 0.25 mg (not administered)  aspirin EC tablet 81 mg (not administered)  pravastatin (PRAVACHOL) tablet 10 mg (10 mg Oral Given 11/21/16 2026)  metoprolol tartrate (LOPRESSOR) tablet 25 mg (25 mg Oral Given 11/21/16 2110)  QUEtiapine (SEROQUEL) tablet 100 mg (100 mg Oral Given 11/21/16 2110)  heparin ADULT infusion 100 units/mL (25000 units/236mL sodium chloride 0.45%) (650 Units/hr Intravenous Rate/Dose Verify 11/22/16 0800)  aspirin chewable tablet 324 mg (324 mg Oral Given 11/21/16 1416)  nitroGLYCERIN (NITROGLYN) 2 % ointment 1 inch (1 inch Topical Given 11/21/16 1415)  heparin bolus via infusion 4,000 Units (4,000 Units Intravenous  Bolus from Bag 11/21/16 1452)  ketorolac (TORADOL) 30 MG/ML injection 30 mg (30 mg Intravenous Given 11/21/16 1447)  fentaNYL (SUBLIMAZE) injection 50 mcg (50 mcg Intravenous Given 11/21/16 1548)  technetium tetrofosmin (TC-MYOVIEW) injection 10 millicurie (10 millicuries Intravenous Contrast Given 11/22/16 0836)  regadenoson (LEXISCAN) injection SOLN 0.4 mg (0.4 mg Intravenous Given 11/22/16 0929)     NEW OUTPATIENT MEDICATIONS STARTED DURING THIS VISIT:  None   Note:  This document was prepared using Dragon voice recognition software and may include unintentional dictation errors.  Alona Bene, MD Emergency Medicine   Maia Plan, MD 11/22/16 [phone]

## 2016-11-21 NOTE — ED Notes (Signed)
Patient transported to X-ray 

## 2016-11-21 NOTE — ED Notes (Signed)
Report given to Arlice Colt, receiving nurse at [facility]

## 2016-11-22 ENCOUNTER — Inpatient Hospital Stay (HOSPITAL_COMMUNITY): Payer: Medicaid Other

## 2016-11-22 DIAGNOSIS — I10 Essential (primary) hypertension: Secondary | ICD-10-CM

## 2016-11-22 DIAGNOSIS — I429 Cardiomyopathy, unspecified: Secondary | ICD-10-CM

## 2016-11-22 DIAGNOSIS — R079 Chest pain, unspecified: Secondary | ICD-10-CM

## 2016-11-22 DIAGNOSIS — R0789 Other chest pain: Secondary | ICD-10-CM

## 2016-11-22 LAB — LIPID PANEL
CHOL/HDL RATIO: 4 ratio
CHOLESTEROL: 158 mg/dL (ref 0–200)
HDL: 40 mg/dL — AB (ref >40)
LDL Cholesterol: 97 mg/dL (ref 0–99)
TRIGLYCERIDES: 103 mg/dL (ref <150)
VLDL: 21 mg/dL (ref 0–40)

## 2016-11-22 LAB — NM MYOCAR MULTI W/SPECT W/WALL MOTION / EF
Estimated workload: 1 METS
Exercise duration (min): 5 min
Exercise duration (sec): 18 s
LVDIAVOL: 84 mL (ref 46–106)
LVSYSVOL: 53 mL
MPHR: 164 {beats}/min
NUC STRESS TID: 0.94
Peak HR: 96 {beats}/min
RATE: 0
Rest HR: 74 {beats}/min
SDS: 0
SRS: 13
SSS: 13

## 2016-11-22 LAB — CBC
HEMATOCRIT: 40.9 % (ref 36.0–46.0)
HEMOGLOBIN: 13.5 g/dL (ref 12.0–15.0)
MCH: 29.3 pg (ref 26.0–34.0)
MCHC: 33 g/dL (ref 30.0–36.0)
MCV: 88.9 fL (ref 78.0–100.0)
Platelets: 314 10*3/uL (ref 150–400)
RBC: 4.6 MIL/uL (ref 3.87–5.11)
RDW: 15 % (ref 11.5–15.5)
WBC: 6 10*3/uL (ref 4.0–10.5)

## 2016-11-22 LAB — BASIC METABOLIC PANEL
Anion gap: 13 (ref 5–15)
BUN: 15 mg/dL (ref 6–20)
CALCIUM: 8.6 mg/dL — AB (ref 8.9–10.3)
CO2: 22 mmol/L (ref 22–32)
Chloride: 104 mmol/L (ref 101–111)
Creatinine, Ser: 0.88 mg/dL (ref 0.44–1.00)
GFR calc Af Amer: >60 mL/min (ref >60)
GFR calc non Af Amer: >60 mL/min (ref >60)
Glucose, Bld: 130 mg/dL — ABNORMAL HIGH (ref 65–99)
POTASSIUM: 3.9 mmol/L (ref 3.5–5.1)
Sodium: 139 mmol/L (ref 135–145)

## 2016-11-22 LAB — ECHOCARDIOGRAM COMPLETE
Height: 62 in
WEIGHTICAEL: 2840 [oz_av]

## 2016-11-22 LAB — TROPONIN I
Troponin I: <0.03 ng/mL (ref <0.03)
Troponin I: <0.03 ng/mL (ref <0.03)

## 2016-11-22 LAB — HEPARIN LEVEL (UNFRACTIONATED): Heparin Unfractionated: 0.3 IU/mL (ref 0.30–0.70)

## 2016-11-22 MED ORDER — TECHNETIUM TC 99M TETROFOSMIN IV KIT
10.0000 | PACK | Freq: Once | INTRAVENOUS | Status: AC | PRN
  Administered 2016-11-22: 10 via INTRAVENOUS

## 2016-11-22 MED ORDER — TECHNETIUM TC 99M TETROFOSMIN IV KIT
30.0000 | PACK | Freq: Once | INTRAVENOUS | Status: AC | PRN
  Administered 2016-11-22: 30 via INTRAVENOUS

## 2016-11-22 MED ORDER — REGADENOSON 0.4 MG/5ML IV SOLN
INTRAVENOUS | Status: AC
  Administered 2016-11-22: 0.4 mg via INTRAVENOUS
  Filled 2016-11-22: qty 5

## 2016-11-22 MED ORDER — REGADENOSON 0.4 MG/5ML IV SOLN
0.4000 mg | Freq: Once | INTRAVENOUS | Status: AC
  Administered 2016-11-22: 0.4 mg via INTRAVENOUS
  Filled 2016-11-22: qty 5

## 2016-11-22 NOTE — Progress Notes (Signed)
   Patient presented for [facility]. Tolerated procedure well. Pending final stress imaging result.  Berton Bon, AGNP-C 11/22/2016  10:14 AM Pager: [phone]

## 2016-11-22 NOTE — Progress Notes (Signed)
  Echocardiogram 2D Echocardiogram has been performed.  Sherri Graham 11/22/2016, 4:25 PM

## 2016-11-22 NOTE — Progress Notes (Signed)
ANTICOAGULATION CONSULT NOTE - Follow Up Consult  Pharmacy Consult for heparin Indication: chest pain/ACS  Labs:  Recent Labs  11/21/16 1301 11/21/16 1330 11/21/16 1331 11/21/16 1849 11/21/16 2215 11/22/16 0224 11/22/16 1035  HGB 13.9  --   --   --   --   --  13.5  HCT 41.0  --   --   --   --   --  40.9  PLT 311  --   --   --   --   --  314  APTT  --   --  29  --   --   --   --   LABPROT  --   --  13.0  --   --   --   --   INR  --   --  0.98  --   --   --   --   HEPARINUNFRC  --   --   --   --  1.03*  --  0.30  CREATININE  --  0.87  --   --   --   --  0.88  TROPONINI  --   --   --  <0.03  --  <0.03 <0.03    Assessment: 56yo female now at goal on heparin with initial dosing for CP.  No bleeding issues noted. S/p lexiscan today, results pending. CBC stable.   Goal of Therapy:  Heparin level 0.3-0.7 units/ml   Plan:  Increase heparin to 700 units/hr Daily heparin level and cbc  Sheppard Coil PharmD., BCPS Clinical Pharmacist Pager [phone] 11/22/2016 2:31 PM

## 2016-11-22 NOTE — Progress Notes (Signed)
Progress Note  Patient Name: Sherri Graham Date of Encounter: 11/22/2016  Primary Cardiologist: New- Dr. Herbie Baltimore  Subjective   Pt feeling well this morning. She had intermittent chest discomfort yesterday, none since midnight. I am seeing the patient in stress lab.  Inpatient Medications    Scheduled Meds: . aspirin EC  81 mg Oral Daily  . metoprolol tartrate  25 mg Oral BID  . pravastatin  10 mg Oral q1800  . QUEtiapine  100 mg Oral QHS   Continuous Infusions: . heparin 650 Units/hr (11/22/16 0800)   PRN Meds: ALPRAZolam   Vital Signs    Vitals:   11/22/16 0549 11/22/16 0800 11/22/16 0805 11/22/16 0908  BP: 104/83 93/78 100/80 (!) 154/71  Pulse: 73 72 77   Resp: 18     Temp: 97.6 F (36.4 C)     TempSrc: Oral     SpO2: 98% 98% 97%   Weight: 177 lb 8 oz (80.5 kg)     Height:        Intake/Output Summary (Last 24 hours) at 11/22/16 0932 Last data filed at 11/22/16 0827  Gross per 24 hour  Intake            454.5 ml  Output              250 ml  Net            204.5 ml   Filed Weights   11/21/16 1146 11/22/16 0549  Weight: 188 lb (85.3 kg) 177 lb 8 oz (80.5 kg)    Telemetry    NSR  ECG    11/21/16 Sinus tach 106 bpm. RAE  Physical Exam   GEN: No acute distress.   Neck: No JVD Cardiac: RRR, no murmurs, rubs, or gallops.  Respiratory: Clear to auscultation bilaterally. GI: Soft, nontender, non-distended  MS: No edema; No deformity. Neuro:  Nonfocal  Psych: Normal affect   Labs    Chemistry Recent Labs Lab 11/21/16 1330  NA 138  K 3.3*  CL 101  CO2 26  GLUCOSE 104*  BUN 10  CREATININE 0.87  CALCIUM 8.9  PROT 7.8  ALBUMIN 4.1  AST 16  ALT 12*  ALKPHOS 98  BILITOT 0.5  GFRNONAA >60  GFRAA >60  ANIONGAP 11     Hematology Recent Labs Lab 11/21/16 1301  WBC 4.8  RBC 4.71  HGB 13.9  HCT 41.0  MCV 87.0  MCH 29.5  MCHC 33.9  RDW 14.9  PLT 311    Cardiac Enzymes Recent Labs Lab 11/21/16 1230 11/21/16 1849  11/22/16 0224  TROPONINI 0.05* <0.03 <0.03   No results for input(s): TROPIPOC in the last 168 hours.   BNPNo results for input(s): BNP, PROBNP in the last 168 hours.   DDimer  Recent Labs Lab 11/21/16 1330  DDIMER <0.27     Radiology    Dg Chest 2 View  Result Date: 11/21/2016 CLINICAL DATA:  Cough and chest pain for 5 days. EXAM: CHEST  2 VIEW COMPARISON:  PA and lateral chest 09/13/2015 and 01/05/2013. FINDINGS: The lungs are clear. Heart size is normal. There is no pneumothorax or pleural effusion. No bony abnormality. IMPRESSION: Negative chest. Electronically Signed   By: Drusilla Kanner M.D.   On: 11/21/2016 12:25    Cardiac Studies   Myoview today: Study Result     There was no ST segment deviation noted during stress.  No T wave inversion was noted during stress.  This is an intermediate  risk study.  The left ventricular ejection fraction is moderately decreased (30-44%).  Nuclear stress EF: 36%.   1. EF 36% with diffuse hypokinesis.  Visually does not look as bad, would get an echocardiogram.  2. There is significant subdiaphragmatic activity at rest and with stress that may be a source of artifact.  3. There is a fixed, medium-sized, moderate intensity apical anterior, apical septal, and true apex perfusion defect.  No evidence for ischemia.   Difficult study.  Intermediate risk because of low EF.  There does not appear to be ischemia.  Hard to tell if the fixed defect is due to prior MI or artifact.  EF may actually be better than calculated, need echo.    Echo Today: Mild global reduction in LV function; grade 1 diastolic dysfunction; sclerotic aortic valve with trace AI.  EF roughly 45-50% with diffuse hypokinesis but no regional wall motion abnormalities   Patient Profile     56 y.o. female witih h/o HTN, HLD, asthma and reported h/o CHF transferred from [facility] for chest pain evaluation.   Assessment & Plan    Atypical Chest Pain  with + Troponin: Intermittent chest pain yesterday, none since midnight. This was somewhat reproducible on exam along both sternal borders and may be related to MSK costochondritis pin in the setting of recent bronchitis exacerbation. She notes cardiac history which includes h/o MI and CHF, treated at [facility] over 10 year ago. She notes prior cath but did not get a stent. Unfortunately, no cath report or CAD anatomy is outlined in records. EKG shows mild sinus tach at 106 bpm. D-dimer negative, ruling out PE. First troponin was 0.05, but subsequent troponins were negative X2. She is currently undergoing a nuclear stress test for further cardiac evaluation.  2. Bipolar Disorder: continue outpatient meds.   3. HLD: pt on pravastatin as an outpatient. Check FLP in the am.   4. HTN: Diastolic BPs have been elevated. Continue home metoprolol and continue to monitor. She may require a 2nd agent for better control.     Signed, Berton Bon, NP  11/22/2016, 9:32 AM    I saw evaluated the patient today after her studies were complete. She's not had any further chest pain. Was a living without any difficulty.  Nuclear stress test results were reviewed. The EF was read as being low, however there was poor visibility and they deferred to the echocardiogram. There was a lot of diaphragmatic attenuation with either breast attenuation and/or prior anterior defect but no evidence of ischemia. Stress test was read as intermediate risk because of low EF -- EF was read as 45-50% by echo with diffuse hypokinesis. We are unaware of her prior cardiac evaluation, but she has not had any heart failure symptoms. In the absence of any recurrence of pain, I think we can consider her pain to be atypical nonanginal muscle skeletal chest pain related to her recent bronchitis episode. I recommended palliative Tylenol and/or NSAIDs when necessary.  Her pressures were labile, but for now we'll just continue her home  medications.  I recommended that she follow-up with her PCP Dr. Claretha Cooper & determine whether or not she would want to follow-up with her prior cardiologist - Dr. Rhona Leavens or with [facility]. '  Bryan Lemma, M.D., M.S. Interventional Cardiologist   Pager # [phone] Phone # [phone] [address] [place], [place] [address]

## 2016-11-22 NOTE — Discharge Summary (Signed)
Discharge Summary    Patient ID: Sherri Graham,  MRN: [id number], DOB/AGE: 04-14-1961 56 y.o.  Admit date: 11/21/2016 Discharge date: 11/22/2016  Primary Care Provider: Trudie Buckler T Primary Cardiologist: Herbie Baltimore   Discharge Diagnoses    Principal Problem:   Chest wall pain Active Problems:   Cardiomyopathy (HCC)   Allergies Allergies  Allergen Reactions  . Penicillins Hives    Has patient had a PCN reaction causing immediate rash, facial/tongue/throat swelling, SOB or lightheadedness with hypotension: Yes Has patient had a PCN reaction causing severe rash involving mucus membranes or skin necrosis: No Has patient had a PCN reaction that required hospitalization No Has patient had a PCN reaction occurring within the last 10 years: No If all of the above answers are "NO", then may proceed with Cephalosporin use.     Diagnostic Studies/Procedures    Stress myoview: 11/22/16   There was no ST segment deviation noted during stress.  No T wave inversion was noted during stress.  This is an intermediate risk study.  The left ventricular ejection fraction is moderately decreased (30-44%).  Nuclear stress EF: 36%.   1. EF 36% with diffuse hypokinesis.  Visually does not look as bad, would get an echocardiogram.  2. There is significant subdiaphragmatic activity at rest and with stress that may be a source of artifact.  3. There is a fixed, medium-sized, moderate intensity apical anterior, apical septal, and true apex perfusion defect.  No evidence for ischemia.   Echo: 11/22/16  Study Conclusions  - Left ventricle: The cavity size was normal. Wall thickness was   normal. Systolic function was mildly reduced. The estimated   ejection fraction was in the range of 45% to 50%. Diffuse   hypokinesis. Doppler parameters are consistent with abnormal left   ventricular relaxation (grade 1 diastolic dysfunction). - Aortic valve: There was trivial  regurgitation.  Impressions:  - Mild global reduction in LV function; grade 1 diastolic   dysfunction; sclerotic aortic valve with trace AI. _____________   History of Present Illness     56 y/o AA female witih h/o HTN, HLD, asthma and reported h/o CHF transferred from [facility] for chest pain evaluation.   She is a poor historian. Per records, she has bipolar disorder and is followed by behavioral medicine and is on multiple psych meds. She was seen at outpatient behavioral health prior to admission.   She reported that she has been followed in the past by a cardiologist in [place], Dr. Rhona Leavens. She admits that she has not seen him in over 1 year. 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 She reports that she had a cath before in [facility]. She is unsure of the exact details but states that she did not get a stent. She is on meds for HTN and HLD and reports full compliance. She denies tobacco use. No h/o DM.  She was in her usual state of health until 3-4 days ago. She developed resting SSCP described as chest tightness/ pressure. Occurred off and on. Also with mild associated dyspnea. She tried to stay in bed most of the day when it first started and avoided exertion. However, given her recurrent pain, she went to her PCP. She was initially felt to have an acute asthma flare and was given steroids and antibiotics, however no improvement. This prompted her to seek urgent care in [place].    At [facility], initial troponin was abnormal at 0.05.  D-dimer negative. EKG shows sinus tach with rate of 106 bpm. K 3.3. Renal function normal with Scr at 0.87. CBC unremarkable. BP had been elevated in the 90s-low 100s diastolic. Nitro patch was placed. Pain improved. She was transferred to [facility] for further work up.   Hospital Course     Consultants: None  She ruled out with enzymes and underwent a Lexiscan myoview that showed intermediate risk due to reduced EF of 36%. No  ischemia noted. Follow up echo showed low normal EF with G1DD. D-dimer was negative.  Her chest pain was mostly worse with deep inspiration and coughing. He was thought to be probably related to chest wall pain from prolonged coughing episode from her recent bronchitis. Reviewing her stress test, there was clearly no evidence of ischemia. Distress is itself was very poor quality, but resting images were worse than stress images. The reduced ejection fraction was not as dramatically reduced on echocardiographic evaluation then was seen on the nuclear stress test. Likely because of poor imaging. Overall she was no longer having pain and therefore, it was unlikely that the very mild troponin elevation was truly abnormal.   She was seen by Dr. Herbie Baltimore on 11/22/16 and determined stable for discharge. She has a cardiologist in [place] that she will follow up with.  _____________  Discharge Vitals Blood pressure 108/90, pulse 72, temperature 98.5 F (36.9 C), temperature source Oral, resp. rate 18, height 5\' 2"  (1.575 m), weight 80.5 kg (177 lb 8 oz), SpO2 100 %.  Filed Weights   11/21/16 1146 11/22/16 0549  Weight: 85.3 kg (188 lb) 80.5 kg (177 lb 8 oz)    Labs & Radiologic Studies    CBC  Recent Labs  11/21/16 1301 11/22/16 1035  WBC 4.8 6.0  NEUTROABS 2.3  --   HGB 13.9 13.5  HCT 41.0 40.9  MCV 87.0 88.9  PLT 311 314   Basic Metabolic Panel  Recent Labs  11/21/16 1330 11/22/16 1035  NA 138 139  K 3.3* 3.9  CL 101 104  CO2 26 22  GLUCOSE 104* 130*  BUN 10 15  CREATININE 0.87 0.88  CALCIUM 8.9 8.6*   Liver Function Tests  Recent Labs  11/21/16 1330  AST 16  ALT 12*  ALKPHOS 98  BILITOT 0.5  PROT 7.8  ALBUMIN 4.1   No results for input(s): LIPASE, AMYLASE in the last 72 hours. Cardiac Enzymes  Recent Labs  11/21/16 1849 11/22/16 0224 11/22/16 1035  TROPONINI <0.03 <0.03 <0.03   BNP Invalid input(s): POCBNP D-Dimer  Recent Labs  11/21/16 1330   DDIMER <0.27   Hemoglobin A1C No results for input(s): HGBA1C in the last 72 hours. Fasting Lipid Panel  Recent Labs  11/22/16 0224  CHOL 158  HDL 40*  LDLCALC 97  TRIG 585  CHOLHDL 4.0   Thyroid Function Tests No results for input(s): TSH, T4TOTAL, T3FREE, THYROIDAB in the last 72 hours.  Invalid input(s): FREET3 _____________  Dg Chest 2 View  Result Date: 11/21/2016 CLINICAL DATA:  Cough and chest pain for 5 days. EXAM: CHEST  2 VIEW COMPARISON:  PA and lateral chest 09/13/2015 and 01/05/2013. FINDINGS: The lungs are clear. Heart size is normal. There is no pneumothorax or pleural effusion. No bony abnormality. IMPRESSION: Negative chest. Electronically Signed   By: Drusilla Kanner M.D.   On: 11/21/2016 12:25   Nm Myocar Multi W/spect W/wall Motion / Ef  Result Date: 11/22/2016  There was no ST segment deviation noted during  stress.  No T wave inversion was noted during stress.  This is an intermediate risk study.  The left ventricular ejection fraction is moderately decreased (30-44%).  Nuclear stress EF: 36%.  1. EF 36% with diffuse hypokinesis.  Visually does not look as bad, would get an echocardiogram. 2. There is significant subdiaphragmatic activity at rest and with stress that may be a source of artifact. 3. There is a fixed, medium-sized, moderate intensity apical anterior, apical septal, and true apex perfusion defect.  No evidence for ischemia. Difficult study.  Intermediate risk because of low EF.  There does not appear to be ischemia.  Hard to tell if the fixed defect is due to prior MI or artifact.  EF may actually be better than calculated, need echo.   Disposition   Pt is being discharged home today in good condition.  Follow-up Plans & Appointments    Follow-up Information    Holley Raring, MD Follow up.   Specialty:  Cardiology Why:  please call to make a follow up appt in the next 2 weeks. Contact information: [address] [place]  [address] [phone]        Bryan Lemma, MD. Schedule an appointment as soon as possible for a visit in 4 week(s).   Specialty:  Cardiology Why:  please make appointment if you wants to follow up with Dr. Bronwen Betters information: [address] [place] [address] [phone]          Discharge Instructions    Diet - low sodium heart healthy    Complete by:  As directed    Discharge instructions    Complete by:  As directed    Take tylenol for  ibuprofen to as needed for musculo skeletal band   Increase activity slowly    Complete by:  As directed       Discharge Medications   Discharge Medication List as of 11/22/2016  6:07 PM    CONTINUE these medications which have NOT CHANGED   Details  albuterol (PROAIR HFA) 108 (90 Base) MCG/ACT inhaler Inhale 2 puffs into the lungs every 6 (six) hours as needed for wheezing or shortness of breath., Historical Med    ALPRAZolam (XANAX) 1 MG tablet Take 1 mg by mouth 3 (three) times daily as needed for anxiety., Starting Tue 11/08/2016, Historical Med    aspirin 81 MG tablet Take 81 mg by mouth daily., Historical Med    Cariprazine HCl (VRAYLAR) 1.5 MG CAPS Take 1.5 mg by mouth daily., Historical Med    cyclobenzaprine (FLEXERIL) 10 MG tablet Take 10 mg by mouth 2 (two) times daily as needed for muscle spasms. , Historical Med    metoprolol tartrate (LOPRESSOR) 25 MG tablet Take 25 mg by mouth daily. , Historical Med    pravastatin (PRAVACHOL) 10 MG tablet Take 10 mg by mouth daily., Historical Med      STOP taking these medications     naproxen (NAPROSYN) 500 MG tablet      QUEtiapine (SEROQUEL) 100 MG tablet      QUEtiapine (SEROQUEL) 25 MG tablet           Outstanding Labs/Studies   None  Duration of Discharge Encounter   Greater than 30 minutes including physician time.  Signed, Bryan Lemma, MD

## 2017-05-02 ENCOUNTER — Emergency Department (HOSPITAL_BASED_OUTPATIENT_CLINIC_OR_DEPARTMENT_OTHER): Payer: Medicaid Other

## 2017-05-02 ENCOUNTER — Inpatient Hospital Stay (HOSPITAL_BASED_OUTPATIENT_CLINIC_OR_DEPARTMENT_OTHER)
Admission: EM | Admit: 2017-05-02 | Discharge: 2017-05-03 | DRG: 948 | Disposition: A | Discharge status: Home / Self Care | Payer: Medicaid Other | Attending: Cardiology | Admitting: Cardiology

## 2017-05-02 ENCOUNTER — Encounter (HOSPITAL_BASED_OUTPATIENT_CLINIC_OR_DEPARTMENT_OTHER): Payer: Self-pay | Admitting: *Deleted

## 2017-05-02 DIAGNOSIS — Y92239 Unspecified place in hospital as the place of occurrence of the external cause: Secondary | ICD-10-CM | POA: Diagnosis not present

## 2017-05-02 DIAGNOSIS — R9431 Abnormal electrocardiogram [ECG] [EKG]: Secondary | ICD-10-CM | POA: Diagnosis present

## 2017-05-02 DIAGNOSIS — I509 Heart failure, unspecified: Secondary | ICD-10-CM | POA: Diagnosis present

## 2017-05-02 DIAGNOSIS — I2510 Atherosclerotic heart disease of native coronary artery without angina pectoris: Secondary | ICD-10-CM | POA: Diagnosis present

## 2017-05-02 DIAGNOSIS — M545 Low back pain: Secondary | ICD-10-CM | POA: Diagnosis present

## 2017-05-02 DIAGNOSIS — I252 Old myocardial infarction: Secondary | ICD-10-CM

## 2017-05-02 DIAGNOSIS — Z880 Allergy status to penicillin: Secondary | ICD-10-CM

## 2017-05-02 DIAGNOSIS — T463X5A Adverse effect of coronary vasodilators, initial encounter: Secondary | ICD-10-CM | POA: Diagnosis not present

## 2017-05-02 DIAGNOSIS — Z79899 Other long term (current) drug therapy: Secondary | ICD-10-CM | POA: Diagnosis not present

## 2017-05-02 DIAGNOSIS — I214 Non-ST elevation (NSTEMI) myocardial infarction: Secondary | ICD-10-CM | POA: Diagnosis present

## 2017-05-02 DIAGNOSIS — E785 Hyperlipidemia, unspecified: Secondary | ICD-10-CM | POA: Diagnosis present

## 2017-05-02 DIAGNOSIS — E7800 Pure hypercholesterolemia, unspecified: Secondary | ICD-10-CM

## 2017-05-02 DIAGNOSIS — R0789 Other chest pain: Secondary | ICD-10-CM | POA: Diagnosis present

## 2017-05-02 DIAGNOSIS — I110 Hypertensive heart disease with heart failure: Secondary | ICD-10-CM | POA: Diagnosis present

## 2017-05-02 DIAGNOSIS — J069 Acute upper respiratory infection, unspecified: Secondary | ICD-10-CM | POA: Diagnosis present

## 2017-05-02 DIAGNOSIS — I10 Essential (primary) hypertension: Secondary | ICD-10-CM

## 2017-05-02 DIAGNOSIS — I429 Cardiomyopathy, unspecified: Secondary | ICD-10-CM | POA: Diagnosis present

## 2017-05-02 DIAGNOSIS — J45909 Unspecified asthma, uncomplicated: Secondary | ICD-10-CM | POA: Diagnosis present

## 2017-05-02 DIAGNOSIS — R748 Abnormal levels of other serum enzymes: Secondary | ICD-10-CM | POA: Diagnosis present

## 2017-05-02 DIAGNOSIS — R931 Abnormal findings on diagnostic imaging of heart and coronary circulation: Secondary | ICD-10-CM | POA: Diagnosis not present

## 2017-05-02 DIAGNOSIS — Z7982 Long term (current) use of aspirin: Secondary | ICD-10-CM | POA: Diagnosis not present

## 2017-05-02 DIAGNOSIS — R05 Cough: Secondary | ICD-10-CM

## 2017-05-02 DIAGNOSIS — F319 Bipolar disorder, unspecified: Secondary | ICD-10-CM | POA: Diagnosis present

## 2017-05-02 DIAGNOSIS — R059 Cough, unspecified: Secondary | ICD-10-CM

## 2017-05-02 DIAGNOSIS — R7989 Other specified abnormal findings of blood chemistry: Secondary | ICD-10-CM | POA: Diagnosis not present

## 2017-05-02 DIAGNOSIS — I959 Hypotension, unspecified: Secondary | ICD-10-CM | POA: Diagnosis not present

## 2017-05-02 DIAGNOSIS — R778 Other specified abnormalities of plasma proteins: Secondary | ICD-10-CM

## 2017-05-02 LAB — COMPREHENSIVE METABOLIC PANEL
ALK PHOS: 93 U/L (ref 38–126)
ALT: 15 U/L (ref 14–54)
AST: 20 U/L (ref 15–41)
Albumin: 4 g/dL (ref 3.5–5.0)
Anion gap: 11 (ref 5–15)
BUN: 6 mg/dL (ref 6–20)
CALCIUM: 9.2 mg/dL (ref 8.9–10.3)
CO2: 27 mmol/L (ref 22–32)
CREATININE: 0.72 mg/dL (ref 0.44–1.00)
Chloride: 103 mmol/L (ref 101–111)
Glucose, Bld: 94 mg/dL (ref 65–99)
Potassium: 3.7 mmol/L (ref 3.5–5.1)
Sodium: 141 mmol/L (ref 135–145)
TOTAL PROTEIN: 7.9 g/dL (ref 6.5–8.1)
Total Bilirubin: 0.4 mg/dL (ref 0.3–1.2)

## 2017-05-02 LAB — CBC
HCT: 40.1 % (ref 36.0–46.0)
Hemoglobin: 13.1 g/dL (ref 12.0–15.0)
MCH: 28.5 pg (ref 26.0–34.0)
MCHC: 32.7 g/dL (ref 30.0–36.0)
MCV: 87.4 fL (ref 78.0–100.0)
PLATELETS: 345 10*3/uL (ref 150–400)
RBC: 4.59 MIL/uL (ref 3.87–5.11)
RDW: 13.9 % (ref 11.5–15.5)
WBC: 7.8 10*3/uL (ref 4.0–10.5)

## 2017-05-02 LAB — URINALYSIS, ROUTINE W REFLEX MICROSCOPIC
Bilirubin Urine: NEGATIVE
GLUCOSE, UA: NEGATIVE mg/dL
HGB URINE DIPSTICK: NEGATIVE
KETONES UR: NEGATIVE mg/dL
LEUKOCYTES UA: NEGATIVE
Nitrite: NEGATIVE
PH: 6 (ref 5.0–8.0)
PROTEIN: NEGATIVE mg/dL
Specific Gravity, Urine: 1.01 (ref 1.005–1.030)

## 2017-05-02 LAB — BRAIN NATRIURETIC PEPTIDE: B NATRIURETIC PEPTIDE 5: 89.3 pg/mL (ref 0.0–100.0)

## 2017-05-02 LAB — PROTIME-INR
INR: 0.96
PROTHROMBIN TIME: 12.7 s (ref 11.4–15.2)

## 2017-05-02 LAB — TROPONIN I: TROPONIN I: 0.19 ng/mL — AB (ref <0.03)

## 2017-05-02 LAB — D-DIMER, QUANTITATIVE: D-Dimer, Quant: <0.27 ug/mL-FEU (ref 0.00–0.50)

## 2017-05-02 MED ORDER — PRAVASTATIN SODIUM 20 MG PO TABS: 10.0000 mg | ORAL_TABLET | Freq: Every day | ORAL | Status: DC

## 2017-05-02 MED ORDER — ASPIRIN 81 MG PO CHEW
324.0000 mg | CHEWABLE_TABLET | ORAL | Status: DC
  Filled 2017-05-02: qty 4

## 2017-05-02 MED ORDER — ASPIRIN EC 81 MG PO TBEC: 81.0000 mg | DELAYED_RELEASE_TABLET | Freq: Every day | ORAL | Status: DC

## 2017-05-02 MED ORDER — CYCLOBENZAPRINE HCL 10 MG PO TABS: 10.0000 mg | ORAL_TABLET | Freq: Two times a day (BID) | ORAL | Status: DC | PRN

## 2017-05-02 MED ORDER — ONDANSETRON HCL 4 MG/2ML IJ SOLN: 4.0000 mg | Freq: Four times a day (QID) | INTRAMUSCULAR | Status: DC | PRN

## 2017-05-02 MED ORDER — NITROGLYCERIN 2 % TD OINT
1.0000 [in_us] | TOPICAL_OINTMENT | Freq: Once | TRANSDERMAL | Status: AC
  Administered 2017-05-02: 1 [in_us] via TOPICAL
  Filled 2017-05-02: qty 1

## 2017-05-02 MED ORDER — NITROGLYCERIN IN D5W 200-5 MCG/ML-% IV SOLN
0.0000 ug/min | INTRAVENOUS | Status: DC
  Administered 2017-05-03: 5 ug/min via INTRAVENOUS
  Filled 2017-05-02: qty 250

## 2017-05-02 MED ORDER — HEPARIN (PORCINE) IN NACL 100-0.45 UNIT/ML-% IJ SOLN
INTRAMUSCULAR | Status: AC
  Filled 2017-05-02: qty 250

## 2017-05-02 MED ORDER — SODIUM CHLORIDE 0.9 % IV SOLN
Freq: Once | INTRAVENOUS | Status: AC
  Administered 2017-05-02: 1000 mL via INTRAVENOUS

## 2017-05-02 MED ORDER — ASPIRIN 81 MG PO TABS: 81.0000 mg | ORAL_TABLET | Freq: Every day | ORAL | Status: DC

## 2017-05-02 MED ORDER — ASPIRIN 300 MG RE SUPP: 300.0000 mg | RECTAL | Status: DC

## 2017-05-02 MED ORDER — ASPIRIN EC 325 MG PO TBEC
325.0000 mg | DELAYED_RELEASE_TABLET | Freq: Once | ORAL | Status: AC
  Administered 2017-05-02: 325 mg via ORAL
  Filled 2017-05-02: qty 1

## 2017-05-02 MED ORDER — ACETAMINOPHEN 325 MG PO TABS: 650.0000 mg | ORAL_TABLET | ORAL | Status: DC | PRN

## 2017-05-02 MED ORDER — NITROGLYCERIN 0.4 MG SL SUBL: 0.4000 mg | SUBLINGUAL_TABLET | SUBLINGUAL | Status: DC | PRN

## 2017-05-02 MED ORDER — ALPRAZOLAM 0.5 MG PO TABS: 1.0000 mg | ORAL_TABLET | Freq: Three times a day (TID) | ORAL | Status: DC | PRN

## 2017-05-02 MED ORDER — HEPARIN BOLUS VIA INFUSION
4000.0000 [IU] | Freq: Once | INTRAVENOUS | Status: AC
  Administered 2017-05-02: 4000 [IU] via INTRAVENOUS

## 2017-05-02 MED ORDER — ALBUTEROL SULFATE (2.5 MG/3ML) 0.083% IN NEBU: 3.0000 mL | INHALATION_SOLUTION | Freq: Four times a day (QID) | RESPIRATORY_TRACT | Status: DC | PRN

## 2017-05-02 MED ORDER — HEPARIN (PORCINE) IN NACL 100-0.45 UNIT/ML-% IJ SOLN
850.0000 [IU]/h | INTRAMUSCULAR | Status: DC
  Administered 2017-05-02: 850 [IU]/h via INTRAVENOUS

## 2017-05-02 MED ORDER — CARIPRAZINE HCL 1.5 MG PO CAPS
1.5000 mg | ORAL_CAPSULE | Freq: Every day | ORAL | Status: DC
Start: 2017-05-03 — End: 2017-05-03

## 2017-05-02 NOTE — Progress Notes (Signed)
New Admission Note:   Arrival Method: Carelink via strectcher Mental Orientation:  A&O Telemetry:Box [id number] Assessment: Completed Skin: Intact IV: LAC, L Hand Pain: 7/10 chest pain EKG obtained. MD notified.  Tubes: None Safety Measures: Safety Fall Prevention Plan has been discussed  Admission: [facility] [address] Orientation: Patient has been orientated to the room, unit and staff.  Family: none present at bedside  Orders to be reviewed and implemented. Will continue to monitor the patient. Call light has been placed within reach and bed alarm has been activated. Tele box applied. CCMD notified    Gregor Hams, RN

## 2017-05-02 NOTE — ED Provider Notes (Signed)
[facility] Provider Note   CSN: [id number] Arrival date & time: 05/02/17  1311     History   Chief Complaint Chief Complaint  Patient presents with  . URI    HPI Sherri Graham is a 56 y.o. female with a history of asthma, CHF, and NSTEMI, who presents to the emergency department with a chief complaint of non-radiating bilateral low back pain that began 3-4 days ago, but significant worsening today. She denies known trauma or injury. She denies dysuria, hematuria, frequency, vaginal discharge, pain, or itching.  She reports her symptoms started 3-4 days ago with a productive cough with green and yellow sputum, bilateral otalgia, worse than baseline dyspnea, and intermittent central chest pressure that radiates to her right chest and below her right breast, and intermittent bilateral low back pain. She reports the chest and back pain are aggravated with lying flat and alleviated by nothing. She reports some episodes have been brought on by exertion. She also reports mild nasal congestion.  She denies fever, chills, sore throat, nausea, vomiting, diarrhea, abdominal pain, headache, discharge from her ears, or sinus pressure.    The history is provided by the patient. No language interpreter was used.    Past Medical History:  Diagnosis Date  . Asthma   . CHF (congestive heart failure) (HCC)   . Depression   . Hypercholesteremia   . Hypertension     Patient Active Problem List   Diagnosis Date Noted  . Coronary disease 05/02/2017  . Elevated troponin   . Essential hypertension   . Pure hypercholesterolemia   . Cardiomyopathy (HCC) 11/22/2016  . NSTEMI (non-ST elevated myocardial infarction) (HCC) 11/21/2016  . Chest wall pain 11/21/2016    Past Surgical History:  Procedure Laterality Date  . ABDOMINAL HYSTERECTOMY    . KNEE SURGERY    . SHOULDER SURGERY    . TONSILLECTOMY    . TYMPANOSTOMY TUBE PLACEMENT      OB History    No data available        Home Medications    Prior to Admission medications   Medication Sig Start Date End Date Taking? Authorizing Provider  albuterol (PROAIR HFA) 108 (90 Base) MCG/ACT inhaler Inhale 2 puffs into the lungs every 6 (six) hours as needed for wheezing or shortness of breath.   Yes [provider]  ALPRAZolam Prudy Feeler) 1 MG tablet Take 1 mg by mouth 3 (three) times daily as needed for anxiety. 11/08/16  Yes [provider]  aspirin 81 MG tablet Take 81 mg by mouth daily.   Yes [provider]  Cariprazine HCl (VRAYLAR) 1.5 MG CAPS Take 1.5 mg by mouth daily.   Yes [provider]  cyclobenzaprine (FLEXERIL) 10 MG tablet Take 10 mg by mouth 2 (two) times daily as needed for muscle spasms.    Yes [provider]  pravastatin (PRAVACHOL) 10 MG tablet Take 10 mg by mouth daily.   Yes [provider]  prazosin (MINIPRESS) 1 MG capsule Take 1 mg by mouth at bedtime.   Yes [provider]  metoprolol tartrate (LOPRESSOR) 25 MG tablet Take 25 mg by mouth daily.     [provider]    Family History Family History  Problem Relation Age of Onset  . Hypertension Mother     Social History Social History  Substance Use Topics  . Smoking status: Never Smoker  . Smokeless tobacco: Never Used  . Alcohol use No     Allergies  Penicillins   Review of Systems Review of Systems  Constitutional: Negative for activity change, chills and fever.  HENT: Positive for congestion, ear pain and rhinorrhea. Negative for sinus pain, sinus pressure and sore throat.   Respiratory: Positive for cough and shortness of breath.   Cardiovascular: Positive for chest pain.  Gastrointestinal: Negative for abdominal pain, diarrhea, nausea and vomiting.  Genitourinary: Negative for dysuria, frequency, hematuria, urgency, vaginal bleeding, vaginal discharge and vaginal pain.  Musculoskeletal: Negative for back pain.  Skin: Negative for rash.   Neurological: Negative for weakness and headaches.     Physical Exam Updated Vital Signs BP 102/65   Pulse (!) 58   Temp 98.1 F (36.7 C) (Oral)   Resp 20   Ht 5\' 3"  (1.6 m)   Wt 82.1 kg (181 lb)   SpO2 95%   BMI 32.06 kg/m   Physical Exam  Constitutional: No distress.  HENT:  Head: Normocephalic.  Nose: Rhinorrhea present. No mucosal edema. Right sinus exhibits no maxillary sinus tenderness and no frontal sinus tenderness. Left sinus exhibits no maxillary sinus tenderness and no frontal sinus tenderness.  Mouth/Throat: Uvula is midline, oropharynx is clear and moist and mucous membranes are normal. No tonsillar exudate.  Right TM is dull. Cone of light is not displaced. No erythema, slight bulging. Left TM with evidence of previous tympanic membrane repair with circumferential erythema.  Eyes: Conjunctivae are normal.  Neck: Neck supple. No JVD present.  Cardiovascular: Normal rate, regular rhythm, normal heart sounds and intact distal pulses.  Exam reveals no gallop and no friction rub.   No murmur heard. Pulmonary/Chest: Effort normal and breath sounds normal. No respiratory distress. She has no wheezes. She has no rales.  Abdominal: Soft. She exhibits no distension.  Neurological: She is alert.  Skin: Skin is warm. No rash noted.  Psychiatric: Her behavior is normal.  Nursing note and vitals reviewed.    ED Treatments / Results  Labs (all labs ordered are listed, but only abnormal results are displayed) Labs Reviewed  TROPONIN I - Abnormal; Notable for the following:       Result Value   Troponin I 0.19 (*)    All other components within normal limits  COMPREHENSIVE METABOLIC PANEL - Abnormal; Notable for the following:    Glucose, Bld 102 (*)    BUN <5 (*)    Calcium 8.6 (*)    Albumin 3.2 (*)    ALT 13 (*)    All other components within normal limits  TROPONIN I - Abnormal; Notable for the following:    Troponin I 0.16 (*)    All other components within  normal limits  BRAIN NATRIURETIC PEPTIDE  URINALYSIS, ROUTINE W REFLEX MICROSCOPIC  CBC  COMPREHENSIVE METABOLIC PANEL  PROTIME-INR  D-DIMER, QUANTITATIVE (NOT AT [facility])  MAGNESIUM  TSH  CBC WITH DIFFERENTIAL/PLATELET  PROTIME-INR  HEPARIN LEVEL (UNFRACTIONATED)  CBC  HIV ANTIBODY (ROUTINE TESTING)  TROPONIN I  TROPONIN I  HEMOGLOBIN A1C  OCCULT BLOOD X 1 CARD TO LAB, STOOL    EKG  EKG Interpretation  Date/Time:  Tuesday May 02 2017 16:06:02 EDT Ventricular Rate:  84 PR Interval:  128 QRS Duration: 90 QT Interval:  368 QTC Calculation: 434 R Axis:   52 Text Interpretation:  Normal sinus rhythm Nonspecific ST and T wave abnormality Abnormal ECG When compared to prior, no significant changes seen.  No STEMI Confirmed by Theda Belfast ([id number]) on 05/02/2017 5:18:01 PM       Radiology Dg  Chest 2 View  Result Date: 05/02/2017 CLINICAL DATA:  Right chest pain, back pain x4 days EXAM: CHEST  2 VIEW COMPARISON:  11/21/2016 FINDINGS: Lungs are clear.  No pleural effusion or pneumothorax. The heart is normal in size. Visualized osseous structures are within normal limits. IMPRESSION: Normal chest radiographs. Electronically Signed   By: Charline Bills M.D.   On: 05/02/2017 17:01    Procedures Procedures (including critical care time)  Medications Ordered in ED Medications  heparin ADULT infusion 100 units/mL (25000 units/23mL sodium chloride 0.45%) (850 Units/hr Intravenous New Bag/Given 05/02/17 1936)  albuterol (PROVENTIL) (2.5 MG/3ML) 0.083% nebulizer solution 3 mL (not administered)  ALPRAZolam (XANAX) tablet 1 mg (not administered)  Cariprazine HCl CAPS 1.5 mg (not administered)  cyclobenzaprine (FLEXERIL) tablet 10 mg (not administered)  pravastatin (PRAVACHOL) tablet 10 mg (not administered)  aspirin chewable tablet 324 mg (324 mg Oral Not Given 05/03/17 0025)    Or  aspirin suppository 300 mg ( Rectal See Alternative 05/03/17 0025)  aspirin EC tablet 81 mg (not  administered)  nitroGLYCERIN (NITROSTAT) SL tablet 0.4 mg (not administered)  acetaminophen (TYLENOL) tablet 650 mg (not administered)  ondansetron (ZOFRAN) injection 4 mg (not administered)  nitroGLYCERIN 50 mg in dextrose 5 % 250 mL (0.2 mg/mL) infusion (5 mcg/min Intravenous New Bag/Given 05/03/17 0018)  aspirin EC tablet 325 mg (325 mg Oral Given 05/02/17 1807)  heparin bolus via infusion 4,000 Units (4,000 Units Intravenous Bolus from Bag 05/02/17 1945)  0.9 %  sodium chloride infusion (1,000 mLs Intravenous New Bag/Given 05/02/17 1942)  nitroGLYCERIN (NITROGLYN) 2 % ointment 1 inch (1 inch Topical Given 05/02/17 2136)     Initial Impression / Assessment and Plan / ED Course  I have reviewed the triage vital signs and the nursing notes.  Pertinent labs & imaging results that were available during my care of the patient were reviewed by me and considered in my medical decision making (see chart for details).     56 year old female with a history of congestive heart failure and elevated troponin presenting to the emergency department with chief complaint of central chest pressure, productive cough, bilateral low back pain, and bilateral otalgia. The patient reports when she had previous chest pressure like this she was admitted to the hospital back in March. Initial troponin 0.19. CBC and electrolytes unremarkable. BNP 89. No acute ST segment elevation or depression on EKG; unchanged from previous. Chest x-ray unremarkable. Discussed the patient with Dr. Rush Landmark, attending physician. Consult to cardiology and spoke with Dr. Rennis Golden who will admit the patient for continued workup of elevated troponin and likely a cardiac cath. Heparin initiated per pharmacy in the ED. The patient appears reasonably stabilized for admission considering the current resources, flow, and capabilities available in the ED at this time, and I doubt any other [facility] requiring further screening and/or treatment in the ED prior to  admission.  Final Clinical Impressions(s) / ED Diagnoses   Final diagnoses:  Elevated troponin    New Prescriptions Current Discharge Medication List       Barkley Boards, PA-C 05/03/17 1610    Tegeler, Canary Brim, MD 05/03/17 1030

## 2017-05-02 NOTE — H&P (Signed)
Cardiology Admission History and Physical:   Patient ID: Sherri Graham; MRN: [id number]; DOB: [birth year]   Admission date: 05/02/2017  Primary Care Provider: Ferman Hamming, MD Primary Cardiologist: Dr. Herbie Baltimore Primary Electrophysiologist:  none  Chief Complaint:  Chest pain  Patient Profile:   Sherri Graham is a 56 y.o. female with a history of HTN, HLD, asthma and reported h/o CHF transferred from [facility] for chest pain evaluation.    History of Present Illness:   Sherri Graham is a 56yo female with a history of HTN, HLD, asthma and reported h/o CHF transferred from [facility] for chest pain evaluation. She has bipolar disorder and is followed by behavioral medicine and is on multiple psych meds.  Per records she has a h/o multiple MIs in 2007-2008 and h/o CHF. She has had a cath before in [facility] but is unsure of the exact details but states that she did not get a stent. She was admitted to [facility] 11/21/2016 with complaints of chest pain and peak trop was 0.05 but subsequent trop were normal.  Nuclear stress test showed no ischemia with EF 36% and diffuse HK and echo showed EF 45-50% with diffuse HK.  D-dimer was normal.  Her CP was atypical and felt to be chest wall pain from prolonged coughing from URI and she was discharged home.    Today she presented to [facility] with complaints of non radiating bilateral  back pain for 3-4 days but became much worse today.  She also noted that she had had a cough productive of green sputum, ear pain, nasal congestion and DOE along with intermittent central chest pain radiating to her right chest.  Pain worse with lying flat but also worse with exertion.  Initial trop was elevated at 0.19 with normal BNP at 89 and she is now transferred to [facility] for further evaluation.  EKG showed NSR with nonspecific T wave abnormality.  Labs are unremarkable except for elevated trop. D-dimer negative.      Past Medical History:  Diagnosis  Date  . Asthma   . CHF (congestive heart failure) (HCC)   . Depression   . Hypercholesteremia   . Hypertension     Past Surgical History:  Procedure Laterality Date  . ABDOMINAL HYSTERECTOMY    . KNEE SURGERY    . SHOULDER SURGERY    . TONSILLECTOMY    . TYMPANOSTOMY TUBE PLACEMENT       Medications Prior to Admission: Prior to Admission medications   Medication Sig Start Date End Date Taking? Authorizing Provider  albuterol (PROAIR HFA) 108 (90 Base) MCG/ACT inhaler Inhale 2 puffs into the lungs every 6 (six) hours as needed for wheezing or shortness of breath.    [provider]  ALPRAZolam Prudy Feeler) 1 MG tablet Take 1 mg by mouth 3 (three) times daily as needed for anxiety. 11/08/16   [provider]  aspirin 81 MG tablet Take 81 mg by mouth daily.    [provider]  Cariprazine HCl (VRAYLAR) 1.5 MG CAPS Take 1.5 mg by mouth daily.    [provider]  cyclobenzaprine (FLEXERIL) 10 MG tablet Take 10 mg by mouth 2 (two) times daily as needed for muscle spasms.     [provider]  metoprolol tartrate (LOPRESSOR) 25 MG tablet Take 25 mg by mouth daily.     [provider]  pravastatin (PRAVACHOL) 10 MG tablet Take 10 mg by mouth daily.  [provider]     Allergies:    Allergies  Allergen Reactions  . Penicillins Hives    Has patient had a PCN reaction causing immediate rash, facial/tongue/throat swelling, SOB or lightheadedness with hypotension: Yes Has patient had a PCN reaction causing severe rash involving mucus membranes or skin necrosis: No Has patient had a PCN reaction that required hospitalization No Has patient had a PCN reaction occurring within the last 10 years: No If all of the above answers are "NO", then may proceed with Cephalosporin use.     Social History:   Social History   Social History  . Marital status: Widowed    Spouse name: N/A  . Number of children: N/A  . Years of education:  N/A   Occupational History  . Not on file.   Social History Main Topics  . Smoking status: Never Smoker  . Smokeless tobacco: Never Used  . Alcohol use No  . Drug use: No  . Sexual activity: Not on file   Other Topics Concern  . Not on file   Social History Narrative  . No narrative on file    Family History:   The patient's family history includes Hypertension in her mother.    ROS:  Please see the history of present illness.  All other ROS reviewed and negative.     Physical Exam/Data:   Vitals:   05/02/17 1738 05/02/17 1807 05/02/17 2114 05/02/17 2200  BP: (!) 131/99 (!) 128/92 (!) 126/98 (!) 131/91  Pulse: 82 88 72 79  Resp: 16 18 18 12   Temp:      TempSrc:      SpO2: 94% 97% 100% 99%  Weight:      Height:       No intake or output data in the 24 hours ending 05/02/17 2323 Filed Weights   05/02/17 1315  Weight: 182 lb (82.6 kg)   Body mass index is 32.24 kg/m.  General:  Well nourished, well developed, in no acute distress HEENT: normal Lymph: no adenopathy Neck: no JVD Endocrine:  No thryomegaly Vascular: No carotid bruits; FA pulses 2+ bilaterally without bruits  Cardiac:  normal S1, S2; RRR; no murmur  Lungs:  clear to auscultation bilaterally, no wheezing, rhonchi or rales  Abd: soft, nontender, no hepatomegaly  Ext: no edema Musculoskeletal:  No deformities, BUE and BLE strength normal and equal Skin: warm and dry  Neuro:  CNs 2-12 intact, no focal abnormalities noted Psych:  Normal affect    EKG:  The ECG that was done today was personally reviewed and demonstrates NSR with nonspecific T wave abnormality  Relevant CV Studies: Nuclear stress test 11/22/2016 Study Result     There was no ST segment deviation noted during stress.  No T wave inversion was noted during stress.  This is an intermediate risk study.  The left ventricular ejection fraction is moderately decreased (30-44%).  Nuclear stress EF: 36%.   1. EF 36% with  diffuse hypokinesis.  Visually does not look as bad, would get an echocardiogram.  2. There is significant subdiaphragmatic activity at rest and with stress that may be a source of artifact.  3. There is a fixed, medium-sized, moderate intensity apical anterior, apical septal, and true apex perfusion defect.  No evidence for ischemia.   Difficult study.  Intermediate risk because of low EF.  There does not appear to be ischemia.  Hard to tell if the fixed defect is due to prior MI or artifact.  EF may actually be better than calculated, need echo.    2D echo 11/22/2016 Study Conclusions  - Left ventricle: The cavity size was normal. Wall thickness was   normal. Systolic function was mildly reduced. The estimated   ejection fraction was in the range of 45% to 50%. Diffuse   hypokinesis. Doppler parameters are consistent with abnormal left   ventricular relaxation (grade 1 diastolic dysfunction). - Aortic valve: There was trivial regurgitation.  Impressions:  - Mild global reduction in LV function; grade 1 diastolic   dysfunction; sclerotic aortic valve with trace AI.  Laboratory Data:  Chemistry Recent Labs Lab 05/02/17 1642  NA 141  K 3.7  CL 103  CO2 27  GLUCOSE 94  BUN 6  CREATININE 0.72  CALCIUM 9.2  GFRNONAA >60  GFRAA >60  ANIONGAP 11     Recent Labs Lab 05/02/17 1642  PROT 7.9  ALBUMIN 4.0  AST 20  ALT 15  ALKPHOS 93  BILITOT 0.4   Hematology Recent Labs Lab 05/02/17 1642  WBC 7.8  RBC 4.59  HGB 13.1  HCT 40.1  MCV 87.4  MCH 28.5  MCHC 32.7  RDW 13.9  PLT 345   Cardiac Enzymes Recent Labs Lab 05/02/17 1642  TROPONINI 0.19*   No results for input(s): TROPIPOC in the last 168 hours.  BNP Recent Labs Lab 05/02/17 1642  BNP 89.3    DDimer  Recent Labs Lab 05/02/17 1642  DDIMER <0.27    Radiology/Studies:  Dg Chest 2 View  Result Date: 05/02/2017 CLINICAL DATA:  Right chest pain, back pain x4 days EXAM: CHEST  2 VIEW COMPARISON:   11/21/2016 FINDINGS: Lungs are clear.  No pleural effusion or pneumothorax. The heart is normal in size. Visualized osseous structures are within normal limits. IMPRESSION: Normal chest radiographs. Electronically Signed   By: Charline Bills M.D.   On: 05/02/2017 17:01    Assessment and Plan:   1.  Chest pain - typical and atypical components with ? H/o remote caths in the past.  Last assessment with nuclear stress test earlier this year showed fixed, medium-sized, moderate intensity apical anterior, apical septal, and true apex perfusion defect.  No evidence for ischemia but EF 36% but EF 45-50% on echo.  No further workup was done as CP was felt to atypical at that time.  She is a difficult historian but states that she has had a cough for a few days and has had pressure pain in her chest with radiation to her left shoulder and back. This occurs when she lays down but also with exertion. Diff Dx includes ACS, acute pericarditis in setting of recent URI symptoms, MSK due to persistent  Coughing.   Initial POC trop is mildly elevated at 0.19 and Ekg showed NSR with no ST changes.  She says that her CP did improve some with NTPaste but is still having 4/10 CP.   - continue to cycle trop until it peaks - Continue IV heparin gtt - d/c NTG paste and start IV NTG gtt to try to get pain free - continue ASA and statin.   - will make NPO for now with possibility of cath in am.  - hold on BB due to asthma and recent URI  2.  HTN - BP elevated on arrival to 4E.   - starting IV NTG gtt should help with this.    3.  Hyperlipidemia  - continue statin - check FLP in am   Severity of Illness:  The appropriate patient status for this patient is OBSERVATION. Observation status is judged to be reasonable and necessary in order to provide the required intensity of service to ensure the patient's safety. The patient's presenting symptoms, physical exam findings, and initial radiographic and laboratory data in  the context of their medical condition is felt to place them at decreased risk for further clinical deterioration. Furthermore, it is anticipated that the patient will be medically stable for discharge from the hospital within 2 midnights of admission. The following factors support the patient status of observation.   " The patient's presenting symptoms include chest pain. " The physical exam findings include none. " The initial radiographic and laboratory data are elevated troponin.     Signed, Armanda Magic, MD  05/02/2017 11:23 PM

## 2017-05-02 NOTE — ED Notes (Signed)
Heparin verified by a adkins rn

## 2017-05-02 NOTE — ED Triage Notes (Signed)
Pt c/o URi symptoms   Pro Cough , congestion and bil ear pain

## 2017-05-02 NOTE — ED Notes (Signed)
PA-C notified pt's troponin is 0.19

## 2017-05-02 NOTE — Progress Notes (Signed)
ANTICOAGULATION CONSULT NOTE - Initial Consult  Pharmacy Consult for Heparin Indication: chest pain/ACS  Allergies  Allergen Reactions  . Penicillins Hives    Has patient had a PCN reaction causing immediate rash, facial/tongue/throat swelling, SOB or lightheadedness with hypotension: Yes Has patient had a PCN reaction causing severe rash involving mucus membranes or skin necrosis: No Has patient had a PCN reaction that required hospitalization No Has patient had a PCN reaction occurring within the last 10 years: No If all of the above answers are "NO", then may proceed with Cephalosporin use.     Patient Measurements: Height: 5\' 3"  (160 cm) Weight: 182 lb (82.6 kg) IBW/kg (Calculated) : 52.4 Heparin Dosing Weight: 70 kg  Vital Signs: Temp: 97.9 F (36.6 C) (09/04 1317) Temp Source: Oral (09/04 1317) BP: 128/92 (09/04 1807) Pulse Rate: 88 (09/04 1807)  Labs:  Recent Labs  05/02/17 1642  HGB 13.1  HCT 40.1  PLT 345  LABPROT 12.7  INR 0.96  CREATININE 0.72  TROPONINI 0.19*    Estimated Creatinine Clearance: 79 mL/min (by C-G formula based on SCr of 0.72 mg/dL).   Medical History: Past Medical History:  Diagnosis Date  . Asthma   . CHF (congestive heart failure) (HCC)   . Depression   . Hypercholesteremia   . Hypertension     Assessment: 42 YOF who presented on 9/4 with CP with positive troponins. Pharmacy was consulted so start Heparin for ACS sx. Hep Wt: 70 kg  The patient was noted to be on a baby ASA PTA but no other blood thinners noted. Baseline CBC wnl.   Goal of Therapy:  Heparin level 0.3-0.7 units/ml Monitor platelets by anticoagulation protocol: Yes   Plan:  1. Heparin bolus of 4000 units x 1 2. Start Heparin at a drip rate of 850 units/hr (8.5 ml/hr) 3. Daily HL, CBC 4. Will continue to monitor for any signs/symptoms of bleeding and will follow up with heparin level in 6 hours   Thank you for allowing pharmacy to be a part of this  patient's care.  Georgina Pillion, PharmD, BCPS Clinical Pharmacist Pager: [phone] Clinical phone for 05/02/2017 from 2:30p-11p: [phone] If after 3:30p, please call main pharmacy at: [phone] 05/02/2017 7:32 PM

## 2017-05-02 NOTE — ED Notes (Signed)
ED Provider at bedside. 

## 2017-05-02 NOTE — ED Notes (Signed)
Pharmacy called re guarding heparin per pharmacy

## 2017-05-03 ENCOUNTER — Encounter (HOSPITAL_COMMUNITY): Payer: Self-pay | Admitting: Cardiovascular Disease

## 2017-05-03 ENCOUNTER — Encounter (HOSPITAL_COMMUNITY)
Admission: EM | Disposition: A | Discharge status: Home / Self Care | Payer: Self-pay | Source: Home / Self Care | Attending: Cardiology

## 2017-05-03 DIAGNOSIS — R931 Abnormal findings on diagnostic imaging of heart and coronary circulation: Secondary | ICD-10-CM

## 2017-05-03 DIAGNOSIS — E785 Hyperlipidemia, unspecified: Secondary | ICD-10-CM

## 2017-05-03 DIAGNOSIS — R059 Cough, unspecified: Secondary | ICD-10-CM

## 2017-05-03 DIAGNOSIS — R05 Cough: Secondary | ICD-10-CM

## 2017-05-03 HISTORY — PX: LEFT HEART CATH AND CORONARY ANGIOGRAPHY: CATH118249

## 2017-05-03 LAB — COMPREHENSIVE METABOLIC PANEL
ALBUMIN: 3.2 g/dL — AB (ref 3.5–5.0)
ALT: 13 U/L — AB (ref 14–54)
AST: 17 U/L (ref 15–41)
Alkaline Phosphatase: 76 U/L (ref 38–126)
Anion gap: 10 (ref 5–15)
BILIRUBIN TOTAL: 0.3 mg/dL (ref 0.3–1.2)
CO2: 26 mmol/L (ref 22–32)
CREATININE: 0.76 mg/dL (ref 0.44–1.00)
Calcium: 8.6 mg/dL — ABNORMAL LOW (ref 8.9–10.3)
Chloride: 105 mmol/L (ref 101–111)
GFR calc Af Amer: >60 mL/min (ref >60)
GFR calc non Af Amer: >60 mL/min (ref >60)
GLUCOSE: 102 mg/dL — AB (ref 65–99)
POTASSIUM: 3.6 mmol/L (ref 3.5–5.1)
Sodium: 141 mmol/L (ref 135–145)
TOTAL PROTEIN: 6.5 g/dL (ref 6.5–8.1)

## 2017-05-03 LAB — LIPID PANEL
Cholesterol: 121 mg/dL (ref 0–200)
HDL: 48 mg/dL (ref >40)
LDL CALC: 61 mg/dL (ref 0–99)
TRIGLYCERIDES: 60 mg/dL (ref <150)
Total CHOL/HDL Ratio: 2.5 RATIO
VLDL: 12 mg/dL (ref 0–40)

## 2017-05-03 LAB — PROTIME-INR
INR: 1.04
Prothrombin Time: 13.5 seconds (ref 11.4–15.2)

## 2017-05-03 LAB — CBC WITH DIFFERENTIAL/PLATELET
BASOS ABS: 0 10*3/uL (ref 0.0–0.1)
Basophils Relative: 0 %
EOS PCT: 2 %
Eosinophils Absolute: 0.1 10*3/uL (ref 0.0–0.7)
HCT: 36.7 % (ref 36.0–46.0)
Hemoglobin: 12 g/dL (ref 12.0–15.0)
LYMPHS ABS: 2.7 10*3/uL (ref 0.7–4.0)
LYMPHS PCT: 35 %
MCH: 28.5 pg (ref 26.0–34.0)
MCHC: 32.7 g/dL (ref 30.0–36.0)
MCV: 87.2 fL (ref 78.0–100.0)
Monocytes Absolute: 0.4 10*3/uL (ref 0.1–1.0)
Monocytes Relative: 6 %
Neutro Abs: 4.5 10*3/uL (ref 1.7–7.7)
Neutrophils Relative %: 57 %
PLATELETS: 318 10*3/uL (ref 150–400)
RBC: 4.21 MIL/uL (ref 3.87–5.11)
RDW: 13.9 % (ref 11.5–15.5)
WBC: 7.9 10*3/uL (ref 4.0–10.5)

## 2017-05-03 LAB — TROPONIN I
TROPONIN I: 0.15 ng/mL — AB (ref <0.03)
TROPONIN I: 0.16 ng/mL — AB (ref <0.03)
Troponin I: 0.09 ng/mL (ref <0.03)

## 2017-05-03 LAB — HEPARIN LEVEL (UNFRACTIONATED): Heparin Unfractionated: 0.44 IU/mL (ref 0.30–0.70)

## 2017-05-03 LAB — TSH: TSH: 1.621 u[IU]/mL (ref 0.350–4.500)

## 2017-05-03 LAB — HEMOGLOBIN A1C
Hgb A1c MFr Bld: 6.5 % — ABNORMAL HIGH (ref 4.8–5.6)
MEAN PLASMA GLUCOSE: 139.85 mg/dL

## 2017-05-03 LAB — MAGNESIUM: Magnesium: 2.2 mg/dL (ref 1.7–2.4)

## 2017-05-03 LAB — HIV ANTIBODY (ROUTINE TESTING W REFLEX): HIV Screen 4th Generation wRfx: NONREACTIVE

## 2017-05-03 SURGERY — LEFT HEART CATH AND CORONARY ANGIOGRAPHY
Anesthesia: LOCAL

## 2017-05-03 MED ORDER — SODIUM CHLORIDE 0.9% FLUSH: 3.0000 mL | Freq: Two times a day (BID) | INTRAVENOUS | Status: DC

## 2017-05-03 MED ORDER — LIDOCAINE HCL (PF) 1 % IJ SOLN
INTRAMUSCULAR | Status: AC
  Filled 2017-05-03: qty 30

## 2017-05-03 MED ORDER — SODIUM CHLORIDE 0.9 % IV SOLN: 250.0000 mL | INTRAVENOUS | Status: DC | PRN

## 2017-05-03 MED ORDER — HEPARIN (PORCINE) IN NACL 2-0.9 UNIT/ML-% IJ SOLN
INTRAMUSCULAR | Status: AC
  Filled 2017-05-03: qty 1000

## 2017-05-03 MED ORDER — VERAPAMIL HCL 2.5 MG/ML IV SOLN
INTRAVENOUS | Status: AC
  Filled 2017-05-03: qty 2

## 2017-05-03 MED ORDER — ASPIRIN 81 MG PO CHEW
81.0000 mg | CHEWABLE_TABLET | ORAL | Status: AC
  Administered 2017-05-03: 81 mg via ORAL
  Filled 2017-05-03: qty 1

## 2017-05-03 MED ORDER — HEPARIN (PORCINE) IN NACL 2-0.9 UNIT/ML-% IJ SOLN
INTRAMUSCULAR | Status: AC | PRN
  Administered 2017-05-03: 1000 mL

## 2017-05-03 MED ORDER — FENTANYL CITRATE (PF) 100 MCG/2ML IJ SOLN
INTRAMUSCULAR | Status: AC
  Filled 2017-05-03: qty 2

## 2017-05-03 MED ORDER — MIDAZOLAM HCL 2 MG/2ML IJ SOLN
INTRAMUSCULAR | Status: DC | PRN
  Administered 2017-05-03: 2 mg via INTRAVENOUS

## 2017-05-03 MED ORDER — MIDAZOLAM HCL 2 MG/2ML IJ SOLN
INTRAMUSCULAR | Status: AC
  Filled 2017-05-03: qty 2

## 2017-05-03 MED ORDER — SODIUM CHLORIDE 0.9 % WEIGHT BASED INFUSION: 1.0000 mL/kg/h | INTRAVENOUS | Status: DC

## 2017-05-03 MED ORDER — SODIUM CHLORIDE 0.9 % WEIGHT BASED INFUSION: 3.0000 mL/kg/h | INTRAVENOUS | Status: DC

## 2017-05-03 MED ORDER — SODIUM CHLORIDE 0.9% FLUSH: 3.0000 mL | INTRAVENOUS | Status: DC | PRN

## 2017-05-03 MED ORDER — HEPARIN SODIUM (PORCINE) 1000 UNIT/ML IJ SOLN
INTRAMUSCULAR | Status: AC
  Filled 2017-05-03: qty 1

## 2017-05-03 MED ORDER — LIDOCAINE HCL (PF) 1 % IJ SOLN
INTRAMUSCULAR | Status: DC | PRN
  Administered 2017-05-03: 2 mL

## 2017-05-03 MED ORDER — HEPARIN SODIUM (PORCINE) 1000 UNIT/ML IJ SOLN
INTRAMUSCULAR | Status: DC | PRN
  Administered 2017-05-03: 4000 [IU] via INTRAVENOUS

## 2017-05-03 MED ORDER — IOPAMIDOL (ISOVUE-370) INJECTION 76%
INTRAVENOUS | Status: AC
  Filled 2017-05-03: qty 100

## 2017-05-03 MED ORDER — VERAPAMIL HCL 2.5 MG/ML IV SOLN
INTRAVENOUS | Status: DC | PRN
  Administered 2017-05-03: 10 mL via INTRA_ARTERIAL

## 2017-05-03 MED ORDER — SODIUM CHLORIDE 0.9% FLUSH
3.0000 mL | INTRAVENOUS | Status: DC | PRN
Start: 2017-05-03 — End: 2017-05-03

## 2017-05-03 MED ORDER — IOPAMIDOL (ISOVUE-370) INJECTION 76%
INTRAVENOUS | Status: DC | PRN
  Administered 2017-05-03: 55 mL via INTRA_ARTERIAL

## 2017-05-03 MED ORDER — METOPROLOL TARTRATE 12.5 MG HALF TABLET
12.5000 mg | ORAL_TABLET | Freq: Two times a day (BID) | ORAL | Status: DC
  Administered 2017-05-03: 12.5 mg via ORAL
  Filled 2017-05-03: qty 1

## 2017-05-03 MED ORDER — DEXTROMETHORPHAN HBR 15 MG/5ML PO SYRP: 10.0000 mL | ORAL_SOLUTION | Freq: Four times a day (QID) | ORAL | 0 refills | Status: DC | PRN

## 2017-05-03 MED ORDER — FENTANYL CITRATE (PF) 100 MCG/2ML IJ SOLN
INTRAMUSCULAR | Status: DC | PRN
  Administered 2017-05-03: 25 ug via INTRAVENOUS

## 2017-05-03 SURGICAL SUPPLY — 11 items
CATH 5FR JL3.5 JR4 ANG PIG MP (CATHETERS) ×2 IMPLANT
CATH LAUNCHER 5F JR4 (CATHETERS) ×2 IMPLANT
DEVICE RAD COMP TR BAND LRG (VASCULAR PRODUCTS) ×2 IMPLANT
GLIDESHEATH SLEND SS 6F .021 (SHEATH) ×2 IMPLANT
GUIDEWIRE INQWIRE 1.5J.035X260 (WIRE) ×1 IMPLANT
INQWIRE 1.5J .035X260CM (WIRE) ×2
KIT ESSENTIALS PG (KITS) ×2 IMPLANT
KIT HEART LEFT (KITS) ×2 IMPLANT
PACK CARDIAC CATHETERIZATION (CUSTOM PROCEDURE TRAY) ×2 IMPLANT
TRANSDUCER W/STOPCOCK (MISCELLANEOUS) ×2 IMPLANT
TUBING CIL FLEX 10 FLL-RA (TUBING) ×2 IMPLANT

## 2017-05-03 NOTE — Progress Notes (Signed)
Pt BP 99/72 HR dropping in 50's. MD notified. Verbal order given to stop Nitro. Will continue to monitor. Lurena Joiner

## 2017-05-03 NOTE — Discharge Summary (Signed)
The patient has been seen in conjunction with Laverda Page, NP-C. All aspects of care have been considered and discussed. The patient has been personally interviewed, examined, and all clinical data has been reviewed.   The coronary angiogram done earlier this morning did not reveal obstructive coronary disease or wall motion abnormality. The coronaries were essentially normal. LV function also documented to be normal with normal filling pressures.  Chest discomfort is not cardiac in etiology.  Plan discharge after ambulation.  Follow-up with primary care  Discharge Summary    Patient ID: Sherri Graham,  MRN: [id number], DOB/AGE: [birth year] 56 y.o.  Admit date: 05/02/2017 Discharge date: 05/03/2017  Primary Care Provider: Ferman Hamming Primary Cardiologist: Herbie Baltimore   Discharge Diagnoses    Active Problems:   Elevated troponin   Essential hypertension   Pure hypercholesterolemia   Cough   Allergies Allergies  Allergen Reactions  . Penicillins Hives and Other (See Comments)    Has patient had a PCN reaction causing immediate rash, facial/tongue/throat swelling, SOB or lightheadedness with hypotension: Yes Has patient had a PCN reaction causing severe rash involving mucus membranes or skin necrosis: No Has patient had a PCN reaction that required hospitalization No Has patient had a PCN reaction occurring within the last 10 years: No If all of the above answers are "NO", then may proceed with Cephalosporin use.     Diagnostic Studies/Procedures    05/03/17-Cardiac cath _____________   History of Present Illness     Sherri Graham is a 56 y.o. female with a history of HTN, HLD, asthma and reported h/o CHF transferred from [facility] for chest pain evaluation. She has bipolar disorder and is followed by behavioral medicine and is on multiple psych meds.  Per records she has a h/o multiple MIs in 2007-2008 and h/o CHF. She has had a cath before in [facility]  but is unsure of the exact details but stated that she did not get a stent. She was admitted to [facility] 11/21/2016 with complaints of chest pain and peak trop was 0.05 but subsequent trop were normal.  Nuclear stress test showed no ischemia with EF 36% and diffuse HK and echo showed EF 45-50% with diffuse HK.  D-dimer was normal.  Her CP was atypical and felt to be chest wall pain from prolonged coughing from URI and she was discharged home. 05/02/17 she presented to [facility] with complaints of non radiating bilateral  back pain for 3-4 days but became much worse today.  She also noted that she had had a cough productive of green sputum, ear pain, nasal congestion and DOE along with intermittent central chest pain radiating to her right chest.  Pain worse with lying flat but also worse with exertion.  Initial trop was elevated at 0.19 with normal BNP at 89 and she was transferred to [facility] for further evaluation. EKG showed NSR with nonspecific T wave abnormality. Labs are unremarkable except for elevated trop. D-dimer negative. She was placed on IV nitro and heparin with plans for cath.   Hospital Course     Her pain improved with IV nitroglycerin, but blood pressure became soft therefore was discontinued. She underwent cardiac cath with Dr. Excell Seltzer noted above with normal coronaries. Troponin peaked at 0.19. Her home beta blocker was initially held on admission, but resumed precath with no complications. LDL 61 total cholesterol 121. LV function was documented as normal on cath along with normal filling pressures. Chest pain was  felt to be noncardiac in nature. She was able to ambulate post cath, and no complications noted. Given post cath restrictions and all questions answered. Given Rx for cough medication at discharge.   Sherri Graham was seen by Dr. Katrinka Blazing and determined stable for discharge home. Follow up in the office has been arranged. Medications are listed below.   _____________  Discharge  Vitals Blood pressure (!) 123/104, pulse 68, temperature 97.9 F (36.6 C), temperature source Oral, resp. rate 17, height 5\' 3"  (1.6 m), weight 181 lb (82.1 kg), SpO2 95 %.  Filed Weights   05/02/17 1315 05/02/17 2336  Weight: 182 lb (82.6 kg) 181 lb (82.1 kg)    Labs & Radiologic Studies    CBC  Recent Labs  05/02/17 1642 05/03/17 0014  WBC 7.8 7.9  NEUTROABS  --  4.5  HGB 13.1 12.0  HCT 40.1 36.7  MCV 87.4 87.2  PLT 345 318   Basic Metabolic Panel  Recent Labs  05/02/17 1642 05/03/17 0014  NA 141 141  K 3.7 3.6  CL 103 105  CO2 27 26  GLUCOSE 94 102*  BUN 6 <5*  CREATININE 0.72 0.76  CALCIUM 9.2 8.6*  MG  --  2.2   Liver Function Tests  Recent Labs  05/02/17 1642 05/03/17 0014  AST 20 17  ALT 15 13*  ALKPHOS 93 76  BILITOT 0.4 0.3  PROT 7.9 6.5  ALBUMIN 4.0 3.2*   No results for input(s): LIPASE, AMYLASE in the last 72 hours. Cardiac Enzymes  Recent Labs  05/03/17 0014 05/03/17 0549 05/03/17 1209  TROPONINI 0.16* 0.15* 0.09*   BNP Invalid input(s): POCBNP D-Dimer  Recent Labs  05/02/17 1642  DDIMER <0.27   Hemoglobin A1C  Recent Labs  05/03/17 0014  HGBA1C 6.5*   Fasting Lipid Panel  Recent Labs  05/03/17 0014  CHOL 121  HDL 48  LDLCALC 61  TRIG 60  CHOLHDL 2.5   Thyroid Function Tests  Recent Labs  05/03/17 0014  TSH 1.621   _____________  Dg Chest 2 View  Result Date: 05/02/2017 CLINICAL DATA:  Right chest pain, back pain x4 days EXAM: CHEST  2 VIEW COMPARISON:  11/21/2016 FINDINGS: Lungs are clear.  No pleural effusion or pneumothorax. The heart is normal in size. Visualized osseous structures are within normal limits. IMPRESSION: Normal chest radiographs. Electronically Signed   By: Charline Bills M.D.   On: 05/02/2017 17:01   Disposition   Pt is being discharged home today in good condition.  Follow-up Plans & Appointments    Follow-up Information    Azalee Course, [place] Follow up on 05/18/2017.    Specialties:  Cardiology, Radiology Why:  at 10:30am for your follow up appt.  Contact information: [address] [place] [address] [phone]          Discharge Instructions    Call MD for:  redness, tenderness, or signs of infection (pain, swelling, redness, odor or green/yellow discharge around incision site)    Complete by:  As directed    Diet - low sodium heart healthy    Complete by:  As directed    Discharge instructions    Complete by:  As directed    Radial Site Care Refer to this sheet in the next few weeks. These instructions provide you with information on caring for yourself after your procedure. Your caregiver may also give you more specific instructions. Your treatment has been planned according to current medical practices, but  problems sometimes occur. Call your caregiver if you have any problems or questions after your procedure. HOME CARE INSTRUCTIONS You may shower the day after the procedure.Remove the bandage (dressing) and gently wash the site with plain soap and water.Gently pat the site dry.  Do not apply powder or lotion to the site.  Do not submerge the affected site in water for 3 to 5 days.  Inspect the site at least twice daily.  Do not flex or bend the affected arm for 24 hours.  No lifting over 5 pounds (2.3 kg) for 5 days after your procedure.  Do not drive home if you are discharged the same day of the procedure. Have someone else drive you.  You may drive 24 hours after the procedure unless otherwise instructed by your caregiver.  What to expect: Any bruising will usually fade within 1 to 2 weeks.  Blood that collects in the tissue (hematoma) may be painful to the touch. It should usually decrease in size and tenderness within 1 to 2 weeks.  SEEK IMMEDIATE MEDICAL CARE IF: You have unusual pain at the radial site.  You have redness, warmth, swelling, or pain at the radial site.  You have drainage (other than a small  amount of blood on the dressing).  You have chills.  You have a fever or persistent symptoms for more than 72 hours.  You have a fever and your symptoms suddenly get worse.  Your arm becomes pale, cool, tingly, or numb.  You have heavy bleeding from the site. Hold pressure on the site.   Follow up with cardiology has been arranged. Please call with any questions.   Increase activity slowly    Complete by:  As directed       Discharge Medications     Medication List    TAKE these medications   ALPRAZolam 1 MG tablet Commonly known as:  XANAX Take 1 mg by mouth 3 (three) times daily as needed for anxiety.   aspirin 81 MG tablet Take 81 mg by mouth daily.   buPROPion 300 MG 24 hr tablet Commonly known as:  WELLBUTRIN XL Take 300 mg by mouth daily.   cyclobenzaprine 10 MG tablet Commonly known as:  FLEXERIL Take 10 mg by mouth 2 (two) times daily as needed for muscle spasms.   dextromethorphan 15 MG/5ML syrup Take 10 mLs (30 mg total) by mouth 4 (four) times daily as needed for cough.   pravastatin 10 MG tablet Commonly known as:  PRAVACHOL Take 10 mg by mouth daily.   prazosin 2 MG capsule Commonly known as:  MINIPRESS Take 2 mg by mouth at bedtime.   PROAIR HFA 108 (90 Base) MCG/ACT inhaler Generic drug:  albuterol Inhale 2 puffs into the lungs every 6 (six) hours as needed for wheezing or shortness of breath.   VRAYLAR 1.5 MG Caps Generic drug:  Cariprazine HCl Take 1.5 mg by mouth daily.         Outstanding Labs/Studies   N/a  Duration of Discharge Encounter   Greater than 30 minutes including physician time.  Signed, Laverda Page NP-C 05/03/2017, 3:58 PM

## 2017-05-03 NOTE — Progress Notes (Signed)
ANTICOAGULATION CONSULT NOTE Pharmacy Consult for Heparin Indication: chest pain/ACS  Allergies  Allergen Reactions  . Penicillins Hives    Has patient had a PCN reaction causing immediate rash, facial/tongue/throat swelling, SOB or lightheadedness with hypotension: Yes Has patient had a PCN reaction causing severe rash involving mucus membranes or skin necrosis: No Has patient had a PCN reaction that required hospitalization No Has patient had a PCN reaction occurring within the last 10 years: No If all of the above answers are "NO", then may proceed with Cephalosporin use.     Patient Measurements: Height: 5\' 3"  (160 cm) Weight: 181 lb (82.1 kg) IBW/kg (Calculated) : 52.4 Heparin Dosing Weight: 70 kg  Vital Signs: Temp: 97.9 F (36.6 C) (09/05 0401) Temp Source: Oral (09/05 0401) BP: 99/72 (09/05 0401) Pulse Rate: 59 (09/05 0401)  Labs:  Recent Labs  05/02/17 1642 05/03/17 0014 05/03/17 0549 05/03/17 0701  HGB 13.1 12.0  --   --   HCT 40.1 36.7  --   --   PLT 345 318  --   --   LABPROT 12.7 13.5  --   --   INR 0.96 1.04  --   --   HEPARINUNFRC  --   --   --  0.44  CREATININE 0.72 0.76  --   --   TROPONINI 0.19* 0.16* 0.15*  --     Estimated Creatinine Clearance: 78.8 mL/min (by C-G formula based on SCr of 0.76 mg/dL).   Medical History: Past Medical History:  Diagnosis Date  . Asthma   . CHF (congestive heart failure) (HCC)   . Depression   . Hypercholesteremia   . Hypertension     Assessment: 32 YOF who presented on 9/4 with CP with positive troponins. Pharmacy was consulted so start Heparin for ACS sx. Hep Wt: 70 kg  The patient was noted to be on a baby ASA PTA but no other blood thinners noted. Baseline CBC wnl.   Heparin level therapeutic  Goal of Therapy:  Heparin level 0.3-0.7 units/ml Monitor platelets by anticoagulation protocol: Yes   Plan:  Continue heparin at 850 units / hr Follow up after cath  Thank you Okey Regal,  PharmD [phone]  05/03/2017 8:43 AM

## 2017-05-03 NOTE — Progress Notes (Signed)
The patient has been seen in conjunction with Laverda Page,, NP-C. All aspects of care have been considered and discussed. The patient has been personally interviewed, examined, and all clinical data has been reviewed.   Recent intermediate risk nuclear study, trace positive cardiac markers, in setting of recurrent chest pain with typical/atypical features. Recommended coronary angiography to redefine anatomy and help guide therapy.  Procedure and risks have been discussed as noted below.  Progress Note  Patient Name: Sherri Graham Date of Encounter: 05/03/2017  Primary Cardiologist: Herbie Baltimore  Subjective   Feeling better this morning. Currently no chest pain.   Inpatient Medications    Scheduled Meds: . aspirin  324 mg Oral NOW   Or  . aspirin  300 mg Rectal NOW  . aspirin EC  81 mg Oral Daily  . Cariprazine HCl  1.5 mg Oral Daily  . pravastatin  10 mg Oral Daily   Continuous Infusions: . heparin 850 Units/hr (05/02/17 1936)  . nitroGLYCERIN Stopped (05/03/17 0426)   PRN Meds: acetaminophen, albuterol, ALPRAZolam, cyclobenzaprine, nitroGLYCERIN, ondansetron (ZOFRAN) IV   Vital Signs    Vitals:   05/03/17 0100 05/03/17 0200 05/03/17 0300 05/03/17 0401  BP: 103/81 115/77 102/65 99/72  Pulse: 65 65 (!) 58 (!) 59  Resp: 13 18 20 19   Temp:    97.9 F (36.6 C)  TempSrc:    Oral  SpO2: 100% 97% 95% 98%  Weight:      Height:        Intake/Output Summary (Last 24 hours) at 05/03/17 0756 Last data filed at 05/03/17 0300  Gross per 24 hour  Intake            66.95 ml  Output                0 ml  Net            66.95 ml   Filed Weights   05/02/17 1315 05/02/17 2336  Weight: 182 lb (82.6 kg) 181 lb (82.1 kg)    Telemetry    SR - Personally Reviewed  ECG    SR - Personally Reviewed  Physical Exam   General: Well developed, well nourished, female appearing in no acute distress. Head: Normocephalic, atraumatic.  Neck: Supple without bruits, JVD. Lungs:   Resp regular and unlabored, CTA. Heart: RRR, S1, S2, no S3, S4, or murmur; no rub. Abdomen: Soft, non-tender, non-distended with normoactive bowel sounds. No hepatomegaly. No rebound/guarding. No obvious abdominal masses. Extremities: No clubbing, cyanosis, edema. Distal pedal pulses are 2+ bilaterally. Neuro: Alert and oriented X 3. Moves all extremities spontaneously. Psych: Normal affect.  Labs    Chemistry Recent Labs Lab 05/02/17 1642 05/03/17 0014  NA 141 141  K 3.7 3.6  CL 103 105  CO2 27 26  GLUCOSE 94 102*  BUN 6 <5*  CREATININE 0.72 0.76  CALCIUM 9.2 8.6*  PROT 7.9 6.5  ALBUMIN 4.0 3.2*  AST 20 17  ALT 15 13*  ALKPHOS 93 76  BILITOT 0.4 0.3  GFRNONAA >60 >60  GFRAA >60 >60  ANIONGAP 11 10     Hematology Recent Labs Lab 05/02/17 1642 05/03/17 0014  WBC 7.8 7.9  RBC 4.59 4.21  HGB 13.1 12.0  HCT 40.1 36.7  MCV 87.4 87.2  MCH 28.5 28.5  MCHC 32.7 32.7  RDW 13.9 13.9  PLT 345 318    Cardiac Enzymes Recent Labs Lab 05/02/17 1642 05/03/17 0014 05/03/17 0549  TROPONINI 0.19* 0.16* 0.15*  No results for input(s): TROPIPOC in the last 168 hours.   BNP Recent Labs Lab 05/02/17 1642  BNP 89.3     DDimer  Recent Labs Lab 05/02/17 1642  DDIMER <0.27      Radiology    Dg Chest 2 View  Result Date: 05/02/2017 CLINICAL DATA:  Right chest pain, back pain x4 days EXAM: CHEST  2 VIEW COMPARISON:  11/21/2016 FINDINGS: Lungs are clear.  No pleural effusion or pneumothorax. The heart is normal in size. Visualized osseous structures are within normal limits. IMPRESSION: Normal chest radiographs. Electronically Signed   By: Charline Bills M.D.   On: 05/02/2017 17:01    Cardiac Studies   N/a  Patient Profile     56 y.o. female with PMH of HTN, HL, asthma, and chronic systolic HF who presented from [facility] with chest pain.   Assessment & Plan    1. Chest pain: She has both typical and atypical features. Reports she developed URI  symptoms several days ago with cough. This then resolved and developed chest pain with radiation into her back. This became worse yesterday while at work and with movement. Presented to [facility] with symptoms. Trop 0.19, started on heparin and transferred to [facility] for further work up. Trop 0.19>>0.16>>0.15. Was started on nitro drip with improvement in pain, but blood pressure dropped and this was stopped. Reports hx of cath in 2007 and 2010 and was told that she had a blockage but sounds like collaterals at that time. Admission back in 3/18 with intermediate risk stress test 2/2 to low EF at 35%, but Echo with EF 45%. No chest pain this morning. -- remains on IV heparin.  -- plan for cardiac cath today. The patient understands that risks included but are not limited to stroke (1 in 1000), death (1 in 1000), kidney failure [usually temporary] (1 in 500), bleeding (1 in 200), allergic reaction [possibly serious] (1 in 200).   2. HTN: Became hypotensive with nitro gtt, now stable after Dc'ed.  -- will restart home metoprolol at 12.5mg  BID today  3. HL: LDL 97 back in 2/18 -- Lipid panel pending. On pravastatin  Signed, Laverda Page, NP  05/03/2017, 7:56 AM  Pager # [phone]

## 2017-05-03 NOTE — Progress Notes (Signed)
   The coronary angiogram done earlier this morning did not reveal obstructive coronary disease or wall motion abnormality. The coronaries were essentially normal. LV function also documented to be normal with normal filling pressures.  Chest discomfort is not cardiac in etiology.  Plan discharge after ambulation.  Follow-up with primary care.

## 2017-05-03 NOTE — Care Management Note (Signed)
Case Management Note Donn Pierini RN, BSN Unit 4E-Case Manager [phone]  Patient Details  Name: Sherri Graham MRN: [id number] Date of Birth: 1961/04/04  Subjective/Objective:  Pt admitted with c/p, elevated troponin- s/p cath 9/5                   Action/Plan: PTA pt lived at home- plan to d/c home- no CM needs noted for discharge.   Expected Discharge Date:  05/03/17               Expected Discharge Plan:  Home/Self Care  In-House Referral:  NA  Discharge planning Services  CM Consult  Post Acute Care Choice:  NA Choice offered to:     DME Arranged:    DME Agency:     HH Arranged:    HH Agency:     Status of Service:  Completed, signed off  If discussed at Long Length of Stay Meetings, dates discussed:    Discharge Disposition: home/self care   Additional Comments:  Darrold Span, RN 05/03/2017, 4:27 PM

## 2017-05-03 NOTE — Interval H&P Note (Signed)
  Cath Lab Visit (complete for each Cath Lab visit)  Clinical Evaluation Leading to the Procedure:   ACS: Yes.    Non-ACS:    Anginal Classification: CCS III  Anti-ischemic medical therapy: Minimal Therapy (1 class of medications)  Non-Invasive Test Results: No non-invasive testing performed  Prior CABG: No previous CABG      History and Physical Interval Note:  05/03/2017 9:57 AM  Sherri Graham  has presented today for surgery, with the diagnosis of cp  The various methods of treatment have been discussed with the patient and family. After consideration of risks, benefits and other options for treatment, the patient has consented to  Procedure(s): LEFT HEART CATH AND CORONARY ANGIOGRAPHY (N/A) as a surgical intervention .  The patient's history has been reviewed, patient examined, no change in status, stable for surgery.  I have reviewed the patient's chart and labs.  Questions were answered to the patient's satisfaction.     Tonny Bollman

## 2017-05-03 NOTE — Progress Notes (Signed)
Pt states pain is now 4/10 but refuses to allow Nurse to titrate Nitro to obtain level 0 pain. Pt states she doesn't want to get a headache. Pt was educated. Will continue to monitor.  Lurena Joiner

## 2017-05-03 NOTE — H&P (View-Only) (Signed)
The patient has been seen in conjunction with Laverda Page,, NP-C. All aspects of care have been considered and discussed. The patient has been personally interviewed, examined, and all clinical data has been reviewed.   Recent intermediate risk nuclear study, trace positive cardiac markers, in setting of recurrent chest pain with typical/atypical features. Recommended coronary angiography to redefine anatomy and help guide therapy.  Procedure and risks have been discussed as noted below.  Progress Note  Patient Name: Sherri Graham Date of Encounter: 05/03/2017  Primary Cardiologist: Herbie Baltimore  Subjective   Feeling better this morning. Currently no chest pain.   Inpatient Medications    Scheduled Meds: . aspirin  324 mg Oral NOW   Or  . aspirin  300 mg Rectal NOW  . aspirin EC  81 mg Oral Daily  . Cariprazine HCl  1.5 mg Oral Daily  . pravastatin  10 mg Oral Daily   Continuous Infusions: . heparin 850 Units/hr (05/02/17 1936)  . nitroGLYCERIN Stopped (05/03/17 0426)   PRN Meds: acetaminophen, albuterol, ALPRAZolam, cyclobenzaprine, nitroGLYCERIN, ondansetron (ZOFRAN) IV   Vital Signs    Vitals:   05/03/17 0100 05/03/17 0200 05/03/17 0300 05/03/17 0401  BP: 103/81 115/77 102/65 99/72  Pulse: 65 65 (!) 58 (!) 59  Resp: 13 18 20 19   Temp:    97.9 F (36.6 C)  TempSrc:    Oral  SpO2: 100% 97% 95% 98%  Weight:      Height:        Intake/Output Summary (Last 24 hours) at 05/03/17 0756 Last data filed at 05/03/17 0300  Gross per 24 hour  Intake            66.95 ml  Output                0 ml  Net            66.95 ml   Filed Weights   05/02/17 1315 05/02/17 2336  Weight: 182 lb (82.6 kg) 181 lb (82.1 kg)    Telemetry    SR - Personally Reviewed  ECG    SR - Personally Reviewed  Physical Exam   General: Well developed, well nourished, female appearing in no acute distress. Head: Normocephalic, atraumatic.  Neck: Supple without bruits, JVD. Lungs:   Resp regular and unlabored, CTA. Heart: RRR, S1, S2, no S3, S4, or murmur; no rub. Abdomen: Soft, non-tender, non-distended with normoactive bowel sounds. No hepatomegaly. No rebound/guarding. No obvious abdominal masses. Extremities: No clubbing, cyanosis, edema. Distal pedal pulses are 2+ bilaterally. Neuro: Alert and oriented X 3. Moves all extremities spontaneously. Psych: Normal affect.  Labs    Chemistry Recent Labs Lab 05/02/17 1642 05/03/17 0014  NA 141 141  K 3.7 3.6  CL 103 105  CO2 27 26  GLUCOSE 94 102*  BUN 6 <5*  CREATININE 0.72 0.76  CALCIUM 9.2 8.6*  PROT 7.9 6.5  ALBUMIN 4.0 3.2*  AST 20 17  ALT 15 13*  ALKPHOS 93 76  BILITOT 0.4 0.3  GFRNONAA >60 >60  GFRAA >60 >60  ANIONGAP 11 10     Hematology Recent Labs Lab 05/02/17 1642 05/03/17 0014  WBC 7.8 7.9  RBC 4.59 4.21  HGB 13.1 12.0  HCT 40.1 36.7  MCV 87.4 87.2  MCH 28.5 28.5  MCHC 32.7 32.7  RDW 13.9 13.9  PLT 345 318    Cardiac Enzymes Recent Labs Lab 05/02/17 1642 05/03/17 0014 05/03/17 0549  TROPONINI 0.19* 0.16* 0.15*  No results for input(s): TROPIPOC in the last 168 hours.   BNP Recent Labs Lab 05/02/17 1642  BNP 89.3     DDimer  Recent Labs Lab 05/02/17 1642  DDIMER <0.27      Radiology    Dg Chest 2 View  Result Date: 05/02/2017 CLINICAL DATA:  Right chest pain, back pain x4 days EXAM: CHEST  2 VIEW COMPARISON:  11/21/2016 FINDINGS: Lungs are clear.  No pleural effusion or pneumothorax. The heart is normal in size. Visualized osseous structures are within normal limits. IMPRESSION: Normal chest radiographs. Electronically Signed   By: Charline Bills M.D.   On: 05/02/2017 17:01    Cardiac Studies   N/a  Patient Profile     56 y.o. female with PMH of HTN, HL, asthma, and chronic systolic HF who presented from [facility] with chest pain.   Assessment & Plan    1. Chest pain: She has both typical and atypical features. Reports she developed URI  symptoms several days ago with cough. This then resolved and developed chest pain with radiation into her back. This became worse yesterday while at work and with movement. Presented to [facility] with symptoms. Trop 0.19, started on heparin and transferred to [facility] for further work up. Trop 0.19>>0.16>>0.15. Was started on nitro drip with improvement in pain, but blood pressure dropped and this was stopped. Reports hx of cath in 2007 and 2010 and was told that she had a blockage but sounds like collaterals at that time. Admission back in 3/18 with intermediate risk stress test 2/2 to low EF at 35%, but Echo with EF 45%. No chest pain this morning. -- remains on IV heparin.  -- plan for cardiac cath today. The patient understands that risks included but are not limited to stroke (1 in 1000), death (1 in 1000), kidney failure [usually temporary] (1 in 500), bleeding (1 in 200), allergic reaction [possibly serious] (1 in 200).   2. HTN: Became hypotensive with nitro gtt, now stable after Dc'ed.  -- will restart home metoprolol at 12.5mg  BID today  3. HL: LDL 97 back in 2/18 -- Lipid panel pending. On pravastatin  Signed, Laverda Page, NP  05/03/2017, 7:56 AM  Pager # [phone]

## 2017-05-18 ENCOUNTER — Ambulatory Visit (INDEPENDENT_AMBULATORY_CARE_PROVIDER_SITE_OTHER): Payer: Medicaid Other | Admitting: Physician Assistant

## 2017-05-18 ENCOUNTER — Encounter: Payer: Self-pay | Admitting: Physician Assistant

## 2017-05-18 ENCOUNTER — Other Ambulatory Visit: Payer: Self-pay

## 2017-05-18 VITALS — BP 115/86 | HR 81 | Ht 63.0 in | Wt 180.0 lb

## 2017-05-18 DIAGNOSIS — I428 Other cardiomyopathies: Secondary | ICD-10-CM | POA: Diagnosis not present

## 2017-05-18 DIAGNOSIS — I10 Essential (primary) hypertension: Secondary | ICD-10-CM

## 2017-05-18 DIAGNOSIS — F319 Bipolar disorder, unspecified: Secondary | ICD-10-CM | POA: Diagnosis not present

## 2017-05-18 DIAGNOSIS — E785 Hyperlipidemia, unspecified: Secondary | ICD-10-CM

## 2017-05-18 MED ORDER — PRAVASTATIN SODIUM 10 MG PO TABS: 10.0000 mg | ORAL_TABLET | Freq: Every day | ORAL | 3 refills | Status: DC

## 2017-05-18 NOTE — Progress Notes (Signed)
Cardiology Office Note    Date:  05/19/2017   ID:  Sherri Graham, DOB 13-Jul-1961, MRN [id number]  PCP:  Ardyth Gal, MD  Cardiologist:  Dr. Herbie Baltimore   Chief Complaint  Patient presents with  . Follow-up    post hospital. Seen for Dr. Herbie Baltimore    History of Present Illness:  Sherri Graham is a 56 y.o. female with PMH of HTN, HLD, DM II, asthma, bipolar disorder and reported h/o CHF. 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 She reportedly had a cath at [facility], however unclear about the exact detail but she states she did not get a stent. She was previously admitted at [facility] in March 2018 with chest pain, peak troponin was 0.05, however subsequent troponin was normal. Nuclear stress test showed no ischemia, EF 36% and a diffuse hypokinesis. Echocardiogram at that time showed EF 45-50% with diffuse hypokinesis. Her chest pain during this admission has both typical and atypical features. Point-of-care troponin was mildly elevated at 0.19. TSH was normal. Hemoglobin A1c was 6.5. She eventually underwent cardiac catheterization on 05/03/2017 which showed angiographically normal coronaries. Suspected noncardiac symptom.  Patient presents today for cardiology office visit. She continued to have intermittent chest discomfort every other day. Otherwise she has been doing well after recent hospital visit. She has some baseline dyspnea on exertion and would become SOB after walking more than a block. She is stable from cardiac perspective and can follow-up in one year with Dr. Herbie Baltimore. She likely will require a repeat echocardiogram at that time, if her ejection fraction improve, she can follow-up with cardiology more on the as-needed basis.    Past Medical History:  Diagnosis Date  . Asthma   . CHF (congestive heart failure) (HCC)   . Depression   . Hypercholesteremia   . Hypertension     Past Surgical History:  Procedure Laterality  Date  . ABDOMINAL HYSTERECTOMY    . KNEE SURGERY    . LEFT HEART CATH AND CORONARY ANGIOGRAPHY N/A 05/03/2017   Procedure: LEFT HEART CATH AND CORONARY ANGIOGRAPHY;  Surgeon: Tonny Bollman, MD;  Location: [facility] INVASIVE CV LAB;  Service: Cardiovascular;  Laterality: N/A;  . SHOULDER SURGERY    . TONSILLECTOMY    . TYMPANOSTOMY TUBE PLACEMENT      Current Medications: Outpatient Medications Prior to Visit  Medication Sig Dispense Refill  . albuterol (PROAIR HFA) 108 (90 Base) MCG/ACT inhaler Inhale 2 puffs into the lungs every 6 (six) hours as needed for wheezing or shortness of breath.    . ALPRAZolam (XANAX) 1 MG tablet Take 1 mg by mouth 3 (three) times daily as needed for anxiety.  2  . aspirin 81 MG tablet Take 81 mg by mouth daily.    Marland Kitchen buPROPion (WELLBUTRIN XL) 300 MG 24 hr tablet Take 300 mg by mouth daily.    . Cariprazine HCl (VRAYLAR) 1.5 MG CAPS Take 1.5 mg by mouth daily.    . cyclobenzaprine (FLEXERIL) 10 MG tablet Take 10 mg by mouth 2 (two) times daily as needed for muscle spasms.     Marland Kitchen dextromethorphan 15 MG/5ML syrup Take 10 mLs (30 mg total) by mouth 4 (four) times daily as needed for cough. 120 mL 0  . prazosin (MINIPRESS) 2 MG capsule Take 2 mg by mouth at bedtime.     . pravastatin (PRAVACHOL) 10 MG tablet Take 10 mg by mouth daily.  No facility-administered medications prior to visit.      Allergies:   Penicillins   Social History   Social History  . Marital status: Widowed    Spouse name: N/A  . Number of children: N/A  . Years of education: N/A   Social History Main Topics  . Smoking status: Never Smoker  . Smokeless tobacco: Never Used  . Alcohol use No  . Drug use: No  . Sexual activity: Not Asked   Other Topics Concern  . None   Social History Narrative  . None     Family History:  The patient's family history includes Hypertension in her mother.   ROS:   Please see the history of present illness.    ROS All other systems reviewed and  are negative.   PHYSICAL EXAM:   VS:  BP 115/86   Pulse 81   Ht 5\' 3"  (1.6 m)   Wt 180 lb (81.6 kg)   BMI 31.89 kg/m    GEN: Well nourished, well developed, in no acute distress  HEENT: normal  Neck: no JVD, carotid bruits, or masses Cardiac: RRR; no murmurs, rubs, or gallops,no edema  Respiratory:  clear to auscultation bilaterally, normal work of breathing GI: soft, nontender, nondistended, + BS MS: no deformity or atrophy  Skin: warm and dry, no rash Neuro:  Alert and Oriented x 3, Strength and sensation are intact Psych: euthymic mood, full affect  Wt Readings from Last 3 Encounters:  05/18/17 180 lb (81.6 kg)  05/02/17 181 lb (82.1 kg)  11/22/16 177 lb 8 oz (80.5 kg)      Studies/Labs Reviewed:   EKG:  EKG is not ordered today.    Recent Labs: 05/02/2017: B Natriuretic Peptide 89.3 05/03/2017: ALT 13; BUN <5; Creatinine, Ser 0.76; Hemoglobin 12.0; Magnesium 2.2; Platelets 318; Potassium 3.6; Sodium 141; TSH 1.621   Lipid Panel    Component Value Date/Time   CHOL 121 05/03/2017 0014   TRIG 60 05/03/2017 0014   HDL 48 05/03/2017 0014   CHOLHDL 2.5 05/03/2017 0014   VLDL 12 05/03/2017 0014   LDLCALC 61 05/03/2017 0014    Additional studies/ records that were reviewed today include:   Myoview 11/22/2016  There was no ST segment deviation noted during stress.  No T wave inversion was noted during stress.  This is an intermediate risk study.  The left ventricular ejection fraction is moderately decreased (30-44%).  Nuclear stress EF: 36%.   1. EF 36% with diffuse hypokinesis.  Visually does not look as bad, would get an echocardiogram.  2. There is significant subdiaphragmatic activity at rest and with stress that may be a source of artifact.  3. There is a fixed, medium-sized, moderate intensity apical anterior, apical septal, and true apex perfusion defect.  No evidence for ischemia.   Difficult study.  Intermediate risk because of low EF.  There does not  appear to be ischemia.  Hard to tell if the fixed defect is due to prior MI or artifact.  EF may actually be better than calculated, need echo.      Echo 11/22/2016 LV EF: 45% -   50%  Study Conclusions  - Left ventricle: The cavity size was normal. Wall thickness was   normal. Systolic function was mildly reduced. The estimated   ejection fraction was in the range of 45% to 50%. Diffuse   hypokinesis. Doppler parameters are consistent with abnormal left   ventricular relaxation (grade 1 diastolic dysfunction). - Aortic valve:  There was trivial regurgitation.  Impressions:  - Mild global reduction in LV function; grade 1 diastolic   dysfunction; sclerotic aortic valve with trace AI.     Cath 05/03/2017 Conclusion   Angiographically normal coronary arteries  Suspect noncardiac symptoms      ASSESSMENT:    1. NICM (nonischemic cardiomyopathy) (HCC)   2. Essential hypertension   3. Hyperlipidemia, unspecified hyperlipidemia type   4. Bipolar 1 disorder (HCC)      PLAN:  In order of problems listed above:  1. NICM: unclear cause for recently elevated trop. No SOB at rest, still has intermittent CP. Low suspicion for PE given negative d-dimer both in March and also during recent admission on 05/02/2017. EF appears to be 45%. Fixed defect seen on previous Myoview he March likely artifact given the fact that recent cardiac catheterization showed angiographically normal coronaries. The multiple MI she reported likely related to a prior history of chronic elevation of troponin. However given normal coronaries on recent cath, no further cardiac workup is needed in this case. She can follow-up in one year at which time I will defer to Dr. Herbie Baltimore whether or not to repeat echocardiogram, once her EF is normal, she can follow-up with cardiology on an as-needed basis.  2. Hypertension: Blood pressure well-controlled  3. Hyperlipidemia: On Pravachol 10 mg daily, will defer  management per primary care provider.  4. Bipolar disorder: Managed by primary care provider.    Medication Adjustments/Labs and Tests Ordered: Current medicines are reviewed at length with the patient today.  Concerns regarding medicines are outlined above.  Medication changes, Labs and Tests ordered today are listed in the Patient Instructions below. Patient Instructions  Medication Instructions: Your physician recommends that you continue on your current medications as directed. Please refer to the Current Medication list given to you today.  If you need a refill on your cardiac medications before your next appointment, please call your pharmacy.   Follow-Up: Your physician wants you to follow-up in: 12 months with Dr. Herbie Baltimore. You will receive a reminder letter in the mail two months in advance. If you don't receive a letter, please call our office at [phone] to schedule this follow-up appointment.    Thank you for choosing [facility], [place]  05/19/2017 11:41 AM    [facility] [address] [place], [place], [place]  [address] Phone: [phone]; Fax: [phone]

## 2017-05-18 NOTE — Patient Instructions (Signed)
Medication Instructions: Your physician recommends that you continue on your current medications as directed. Please refer to the Current Medication list given to you today.  If you need a refill on your cardiac medications before your next appointment, please call your pharmacy.   Follow-Up: Your physician wants you to follow-up in: 12 months with Dr. Herbie Baltimore. You will receive a reminder letter in the mail two months in advance. If you don't receive a letter, please call our office at [phone] to schedule this follow-up appointment.    Thank you for choosing [facility]!!

## 2017-05-19 ENCOUNTER — Encounter: Payer: Self-pay | Admitting: Physician Assistant

## 2017-05-19 MED ORDER — PRAVASTATIN SODIUM 10 MG PO TABS: 10.0000 mg | ORAL_TABLET | Freq: Every day | ORAL | 3 refills | Status: DC

## 2017-06-28 ENCOUNTER — Emergency Department (HOSPITAL_BASED_OUTPATIENT_CLINIC_OR_DEPARTMENT_OTHER)
Admission: EM | Admit: 2017-06-28 | Discharge: 2017-06-28 | Disposition: A | Discharge status: Home / Self Care | Payer: Medicaid Other | Attending: Emergency Medicine | Admitting: Emergency Medicine

## 2017-06-28 ENCOUNTER — Emergency Department (HOSPITAL_BASED_OUTPATIENT_CLINIC_OR_DEPARTMENT_OTHER): Payer: Medicaid Other

## 2017-06-28 ENCOUNTER — Encounter (HOSPITAL_BASED_OUTPATIENT_CLINIC_OR_DEPARTMENT_OTHER): Payer: Self-pay | Admitting: Emergency Medicine

## 2017-06-28 DIAGNOSIS — I110 Hypertensive heart disease with heart failure: Secondary | ICD-10-CM | POA: Diagnosis not present

## 2017-06-28 DIAGNOSIS — Z79899 Other long term (current) drug therapy: Secondary | ICD-10-CM | POA: Diagnosis not present

## 2017-06-28 DIAGNOSIS — Z7982 Long term (current) use of aspirin: Secondary | ICD-10-CM | POA: Diagnosis not present

## 2017-06-28 DIAGNOSIS — E7800 Pure hypercholesterolemia, unspecified: Secondary | ICD-10-CM | POA: Diagnosis not present

## 2017-06-28 DIAGNOSIS — R0789 Other chest pain: Secondary | ICD-10-CM | POA: Diagnosis not present

## 2017-06-28 DIAGNOSIS — R55 Syncope and collapse: Secondary | ICD-10-CM | POA: Diagnosis present

## 2017-06-28 DIAGNOSIS — I509 Heart failure, unspecified: Secondary | ICD-10-CM | POA: Insufficient documentation

## 2017-06-28 DIAGNOSIS — J45909 Unspecified asthma, uncomplicated: Secondary | ICD-10-CM | POA: Diagnosis not present

## 2017-06-28 DIAGNOSIS — H9202 Otalgia, left ear: Secondary | ICD-10-CM

## 2017-06-28 MED ORDER — KETOROLAC TROMETHAMINE 15 MG/ML IJ SOLN
15.0000 mg | Freq: Once | INTRAMUSCULAR | Status: AC
  Administered 2017-06-28: 15 mg via INTRAMUSCULAR
  Filled 2017-06-28: qty 1

## 2017-06-28 MED ORDER — ACETAMINOPHEN 500 MG PO TABS
1000.0000 mg | ORAL_TABLET | Freq: Once | ORAL | Status: AC
  Administered 2017-06-28: 1000 mg via ORAL
  Filled 2017-06-28: qty 2

## 2017-06-28 MED ORDER — OXYCODONE HCL 5 MG PO TABS
5.0000 mg | ORAL_TABLET | Freq: Once | ORAL | Status: AC
  Administered 2017-06-28: 5 mg via ORAL
  Filled 2017-06-28: qty 1

## 2017-06-28 NOTE — ED Provider Notes (Signed)
[facility] EMERGENCY DEPARTMENT Provider Note   CSN: [id number] Arrival date & time: 06/28/17  1104     History   Chief Complaint Chief Complaint  Patient presents with  . Fall    HPI Sherri Graham is a 56 y.o. female.  56 yo F with a chief complaint of fall.  It sounds like she syncopized at home.  She stood up after waking up and then collapsed to the ground after a few steps.  Complaining of pain to the left chest wall.  This fall occurred 3 days ago.  She is concerned because she still having pain to her left side.  Denies recurrent syncopal event.  Denies chest pain shortness breath abdominal pain hemoptysis lower extremity edema.   The history is provided by the patient.  Illness  This is a new problem. The current episode started more than 2 days ago. The problem occurs constantly. The problem has not changed since onset.Associated symptoms include chest pain. Pertinent negatives include no abdominal pain, no headaches and no shortness of breath. The symptoms are aggravated by twisting and bending. Nothing relieves the symptoms. She has tried nothing for the symptoms. The treatment provided no relief.    Past Medical History:  Diagnosis Date  . Asthma   . CHF (congestive heart failure) (HCC)   . Depression   . Hypercholesteremia   . Hypertension     Patient Active Problem List   Diagnosis Date Noted  . Cough 05/03/2017  . Elevated troponin   . Essential hypertension   . Pure hypercholesterolemia   . Cardiomyopathy (HCC) 11/22/2016  . Chest wall pain 11/21/2016    Past Surgical History:  Procedure Laterality Date  . ABDOMINAL HYSTERECTOMY    . KNEE SURGERY    . LEFT HEART CATH AND CORONARY ANGIOGRAPHY N/A 05/03/2017   Procedure: LEFT HEART CATH AND CORONARY ANGIOGRAPHY;  Surgeon: Tonny Bollman, MD;  Location: [facility] INVASIVE CV LAB;  Service: Cardiovascular;  Laterality: N/A;  . SHOULDER SURGERY    . TONSILLECTOMY    . TYMPANOSTOMY TUBE PLACEMENT       OB History    No data available       Home Medications    Prior to Admission medications   Medication Sig Start Date End Date Taking? Authorizing Provider  albuterol (PROAIR HFA) 108 (90 Base) MCG/ACT inhaler Inhale 2 puffs into the lungs every 6 (six) hours as needed for wheezing or shortness of breath.    [provider]  ALPRAZolam Prudy Feeler) 1 MG tablet Take 1 mg by mouth 3 (three) times daily as needed for anxiety. 11/08/16   [provider]  aspirin 81 MG tablet Take 81 mg by mouth daily.    [provider]  buPROPion (WELLBUTRIN XL) 300 MG 24 hr tablet Take 300 mg by mouth daily. 04/13/17 04/13/18  [provider]  Cariprazine HCl (VRAYLAR) 1.5 MG CAPS Take 1.5 mg by mouth daily.    [provider]  cyclobenzaprine (FLEXERIL) 10 MG tablet Take 10 mg by mouth 2 (two) times daily as needed for muscle spasms.     [provider]  dextromethorphan 15 MG/5ML syrup Take 10 mLs (30 mg total) by mouth 4 (four) times daily as needed for cough. 05/03/17   Arty Baumgartner, NP  pravastatin (PRAVACHOL) 10 MG tablet Take 1 tablet (10 mg total) by mouth daily. 05/19/17   Azalee Course, PA  prazosin (MINIPRESS) 2 MG capsule Take 2 mg by mouth at bedtime.  [provider]    Family History Family History  Problem Relation Age of Onset  . Hypertension Mother     Social History Social History  Substance Use Topics  . Smoking status: Never Smoker  . Smokeless tobacco: Never Used  . Alcohol use No     Allergies   Penicillins   Review of Systems Review of Systems  Constitutional: Negative for chills and fever.  HENT: Negative for congestion and rhinorrhea.   Eyes: Negative for redness and visual disturbance.  Respiratory: Negative for shortness of breath and wheezing.   Cardiovascular: Positive for chest pain. Negative for palpitations.  Gastrointestinal: Negative for abdominal pain, nausea and vomiting.  Genitourinary:  Negative for dysuria and urgency.  Musculoskeletal: Negative for arthralgias and myalgias.  Skin: Negative for pallor and wound.  Neurological: Negative for dizziness and headaches.     Physical Exam Updated Vital Signs BP (!) 122/98 (BP Location: Right Arm)   Pulse 92   Temp 97.8 F (36.6 C) (Oral)   Resp 18   Ht 5\' 2"  (1.575 m)   Wt 79.4 kg (175 lb)   SpO2 100%   BMI 32.01 kg/m   Physical Exam  Constitutional: She is oriented to person, place, and time. She appears well-developed and well-nourished. No distress.  HENT:  Head: Normocephalic and atraumatic.  Eyes: Pupils are equal, round, and reactive to light. EOM are normal.  Neck: Normal range of motion. Neck supple.  Cardiovascular: Normal rate and regular rhythm.  Exam reveals no gallop and no friction rub.   No murmur heard. Pulmonary/Chest: Effort normal. She has no wheezes. She has no rales.  Abdominal: Soft. She exhibits no distension and no mass. There is no tenderness. There is no guarding.  Musculoskeletal: She exhibits no edema or tenderness.  Bruising noted to the left chest wall, no midline spinal tenderness  Neurological: She is alert and oriented to person, place, and time.  Skin: Skin is warm and dry. She is not diaphoretic.  Psychiatric: She has a normal mood and affect. Her behavior is normal.  Nursing note and vitals reviewed.    ED Treatments / Results  Labs (all labs ordered are listed, but only abnormal results are displayed) Labs Reviewed - No data to display  EKG  EKG Interpretation  Date/Time:  Wednesday June 28 2017 12:26:51 EDT Ventricular Rate:  87 PR Interval:    QRS Duration: 103 QT Interval:  388 QTC Calculation: 467 R Axis:   25 Text Interpretation:  Sinus rhythm no wpw, prolonged QT or brugada Otherwise no significant change Confirmed by Melene PlanFloyd, Asherah Lavoy [phone]([id number]) on 06/28/2017 1:09:59 PM       Radiology Dg Ribs Unilateral W/chest Left  Result Date: 06/28/2017 CLINICAL DATA:   Fall 2 nights ago.  Left rib pain. EXAM: LEFT RIBS AND CHEST - 3+ VIEW COMPARISON:  05/02/2017 FINDINGS: Heart and mediastinal contours are within normal limits. No focal opacities or effusions. No acute bony abnormality. No visible rib fracture or pneumothorax. IMPRESSION: No active cardiopulmonary disease. Electronically Signed   By: Charlett NoseKevin  Dover M.D.   On: 06/28/2017 12:17    Procedures Procedures (including critical care time)  Medications Ordered in ED Medications  acetaminophen (TYLENOL) tablet 1,000 mg (1,000 mg Oral Given 06/28/17 1229)  ketorolac (TORADOL) 15 MG/ML injection 15 mg (15 mg Intramuscular Given 06/28/17 1230)  oxyCODONE (Oxy IR/ROXICODONE) immediate release tablet 5 mg (5 mg Oral Given 06/28/17 1229)     Initial Impression / Assessment and Plan / ED  Course  I have reviewed the triage vital signs and the nursing notes.  Pertinent labs & imaging results that were available during my care of the patient were reviewed by me and considered in my medical decision making (see chart for details).     56 yo F with a chief complaint of a fall.  EKG with no concerning signs.  Patient has no continued symptoms.  Likely orthostatic in nature.  X-ray negative for pneumonia or fracture.  Given incentive spirometer.  Discharge home.  1:11 PM:  I have discussed the diagnosis/risks/treatment options with the patient and family and believe the pt to be eligible for discharge home to follow-up with PCP. We also discussed returning to the ED immediately if new or worsening sx occur. We discussed the sx which are most concerning (e.g., sudden worsening pain, fever, inability to tolerate by mouth) that necessitate immediate return. Medications administered to the patient during their visit and any new prescriptions provided to the patient are listed below.  Medications given during this visit Medications  acetaminophen (TYLENOL) tablet 1,000 mg (1,000 mg Oral Given 06/28/17 1229)    ketorolac (TORADOL) 15 MG/ML injection 15 mg (15 mg Intramuscular Given 06/28/17 1230)  oxyCODONE (Oxy IR/ROXICODONE) immediate release tablet 5 mg (5 mg Oral Given 06/28/17 1229)     The patient appears reasonably screen and/or stabilized for discharge and I doubt any other medical condition or other [facility] requiring further screening, evaluation, or treatment in the ED at this time prior to discharge.    Final Clinical Impressions(s) / ED Diagnoses   Final diagnoses:  Ear pain, left  Chest wall pain    New Prescriptions Discharge Medication List as of 06/28/2017 12:21 PM       Melene Plan, DO 06/28/17 1311

## 2017-06-28 NOTE — ED Triage Notes (Signed)
Pt fell from standing to floor on Monday.  Pt c/o left rib pain.  Pain radiating to left hip.

## 2017-06-28 NOTE — Discharge Instructions (Signed)
Luckily your CXR was negative for fracture or pneumonia.   Take 4 over the counter ibuprofen tablets 3 times a day or 2 over-the-counter naproxen tablets twice a day for pain. Also take tylenol 1000mg (2 extra strength) four times a day.    Return for fever, worsening sob.

## 2017-06-28 NOTE — ED Notes (Signed)
Patient transported to X-ray 

## 2018-06-15 ENCOUNTER — Other Ambulatory Visit: Payer: Self-pay | Admitting: Physician Assistant

## 2018-09-24 ENCOUNTER — Other Ambulatory Visit: Payer: Self-pay | Admitting: Cardiology

## 2019-11-01 ENCOUNTER — Encounter: Payer: Self-pay | Admitting: General Practice

## 2020-05-14 ENCOUNTER — Other Ambulatory Visit: Payer: Self-pay

## 2020-05-14 ENCOUNTER — Emergency Department (HOSPITAL_BASED_OUTPATIENT_CLINIC_OR_DEPARTMENT_OTHER): Payer: Medicaid Other

## 2020-05-14 ENCOUNTER — Encounter (HOSPITAL_BASED_OUTPATIENT_CLINIC_OR_DEPARTMENT_OTHER): Payer: Self-pay | Admitting: Emergency Medicine

## 2020-05-14 ENCOUNTER — Emergency Department (HOSPITAL_BASED_OUTPATIENT_CLINIC_OR_DEPARTMENT_OTHER)
Admission: EM | Admit: 2020-05-14 | Discharge: 2020-05-14 | Disposition: A | Discharge status: Home / Self Care | Payer: Medicaid Other | Attending: Emergency Medicine | Admitting: Emergency Medicine

## 2020-05-14 DIAGNOSIS — J45909 Unspecified asthma, uncomplicated: Secondary | ICD-10-CM | POA: Diagnosis not present

## 2020-05-14 DIAGNOSIS — Z79899 Other long term (current) drug therapy: Secondary | ICD-10-CM | POA: Insufficient documentation

## 2020-05-14 DIAGNOSIS — Z7982 Long term (current) use of aspirin: Secondary | ICD-10-CM | POA: Insufficient documentation

## 2020-05-14 DIAGNOSIS — I110 Hypertensive heart disease with heart failure: Secondary | ICD-10-CM | POA: Insufficient documentation

## 2020-05-14 DIAGNOSIS — W010XXA Fall on same level from slipping, tripping and stumbling without subsequent striking against object, initial encounter: Secondary | ICD-10-CM | POA: Diagnosis not present

## 2020-05-14 DIAGNOSIS — S39012A Strain of muscle, fascia and tendon of lower back, initial encounter: Secondary | ICD-10-CM | POA: Insufficient documentation

## 2020-05-14 DIAGNOSIS — M25512 Pain in left shoulder: Secondary | ICD-10-CM | POA: Insufficient documentation

## 2020-05-14 DIAGNOSIS — I509 Heart failure, unspecified: Secondary | ICD-10-CM | POA: Diagnosis not present

## 2020-05-14 DIAGNOSIS — W19XXXA Unspecified fall, initial encounter: Secondary | ICD-10-CM

## 2020-05-14 DIAGNOSIS — Y92003 Bedroom of unspecified non-institutional (private) residence as the place of occurrence of the external cause: Secondary | ICD-10-CM | POA: Insufficient documentation

## 2020-05-14 DIAGNOSIS — M545 Low back pain: Secondary | ICD-10-CM | POA: Diagnosis present

## 2020-05-14 MED ORDER — METHOCARBAMOL 500 MG PO TABS: 500.0000 mg | ORAL_TABLET | Freq: Two times a day (BID) | ORAL | 0 refills | Status: DC

## 2020-05-14 NOTE — ED Provider Notes (Signed)
[facility] EMERGENCY DEPARTMENT Provider Note   CSN: [id number] Arrival date & time: 05/14/20  8832     History Chief Complaint  Patient presents with  . Fall    Sherri Graham is a 59 y.o. female with past medical history of hypertension, CHF presenting to the ED after mechanical fall that occurred last night.  About 12 hours ago was walking in her bedroom when she slipped on something that was on the floor.  States that she fell backwards.  Denies any head injury or loss of consciousness.  She was able to stand up on her own.  Since then she has been having left-sided shoulder pain and left lower back pain.  She reports prior surgery on her rotator cuff several years ago on the left side.  Has tried taking aspirin without significant improvement in her pain.  Denies any headache, vision changes, chest pain, shortness of breath, anticoagulant use, numbness in arms or legs, loss of bowel or bladder function or prior back surgeries.  HPI     Past Medical History:  Diagnosis Date  . Asthma   . CHF (congestive heart failure) (HCC)   . Depression   . Hypercholesteremia   . Hypertension     Patient Active Problem List   Diagnosis Date Noted  . Cough 05/03/2017  . Elevated troponin   . Essential hypertension   . Pure hypercholesterolemia   . Cardiomyopathy (HCC) 11/22/2016  . Chest wall pain 11/21/2016    Past Surgical History:  Procedure Laterality Date  . ABDOMINAL HYSTERECTOMY    . KNEE SURGERY    . LEFT HEART CATH AND CORONARY ANGIOGRAPHY N/A 05/03/2017   Procedure: LEFT HEART CATH AND CORONARY ANGIOGRAPHY;  Surgeon: Tonny Bollman, MD;  Location: [facility] INVASIVE CV LAB;  Service: Cardiovascular;  Laterality: N/A;  . SHOULDER SURGERY    . TONSILLECTOMY    . TYMPANOSTOMY TUBE PLACEMENT       OB History   No obstetric history on file.     Family History  Problem Relation Age of Onset  . Hypertension Mother     Social History   Tobacco Use  . Smoking  status: Never Smoker  . Smokeless tobacco: Never Used  Substance Use Topics  . Alcohol use: No  . Drug use: No    Home Medications Prior to Admission medications   Medication Sig Start Date End Date Taking? Authorizing Provider  albuterol (PROAIR HFA) 108 (90 Base) MCG/ACT inhaler Inhale 2 puffs into the lungs every 6 (six) hours as needed for wheezing or shortness of breath.    [provider]  ALPRAZolam Prudy Feeler) 1 MG tablet Take 1 mg by mouth 3 (three) times daily as needed for anxiety. 11/08/16   [provider]  aspirin 81 MG tablet Take 81 mg by mouth daily.    [provider]  buPROPion (WELLBUTRIN XL) 300 MG 24 hr tablet Take 300 mg by mouth daily. 04/13/17 04/13/18  [provider]  Cariprazine HCl (VRAYLAR) 1.5 MG CAPS Take 1.5 mg by mouth daily.    [provider]  cyclobenzaprine (FLEXERIL) 10 MG tablet Take 10 mg by mouth 2 (two) times daily as needed for muscle spasms.     [provider]  dextromethorphan 15 MG/5ML syrup Take 10 mLs (30 mg total) by mouth 4 (four) times daily as needed for cough. 05/03/17   Arty Baumgartner, NP  methocarbamol (ROBAXIN) 500 MG tablet Take 1 tablet (500 mg total) by mouth 2 (  two) times daily. 05/14/20   Marc Sivertsen, PA-C  pravastatin (PRAVACHOL) 10 MG tablet TAKE 1 TABLET(10 MG) BY MOUTH DAILY 06/15/18   Marykay Lex, MD  prazosin (MINIPRESS) 2 MG capsule Take 2 mg by mouth at bedtime.     [provider]    Allergies    Penicillins  Review of Systems   Review of Systems  Constitutional: Negative for chills and fever.  Musculoskeletal: Positive for arthralgias, back pain and myalgias.  Skin: Negative for wound.  Neurological: Negative for weakness and numbness.    Physical Exam Updated Vital Signs BP 129/89   Pulse 62   Temp 98.2 F (36.8 C) (Oral)   Resp 18   Ht 5\' 3"  (1.6 m)   Wt 79.4 kg   SpO2 100%   BMI 31.00 kg/m   Physical Exam Vitals and nursing note  reviewed.  Constitutional:      General: She is not in acute distress.    Appearance: She is well-developed. She is not diaphoretic.  HENT:     Head: Normocephalic and atraumatic.  Eyes:     General: No scleral icterus.    Conjunctiva/sclera: Conjunctivae normal.  Cardiovascular:     Rate and Rhythm: Normal rate and regular rhythm.     Heart sounds: Normal heart sounds.  Pulmonary:     Effort: Pulmonary effort is normal. No respiratory distress.     Breath sounds: Normal breath sounds.  Musculoskeletal:     Cervical back: Normal range of motion.     Lumbar back: Tenderness present.       Back:     Comments: TTP of the L posterior shoulder without deformities.  No changes to range of motion.  2+ radial pulses noted bilaterally.  2+ DP pulses noted bilaterally.  Tenderness to palpation of the lumbar spine at the midline and bilateral paraspinal musculature.  Area of bruising noted in L lower back/rib area.  Normal sensation to light touch of bilateral upper and lower extremities.  No saddle anesthesia.  No step-off palpated.  Skin:    Findings: No rash.  Neurological:     Mental Status: She is alert.     ED Results / Procedures / Treatments   Labs (all labs ordered are listed, but only abnormal results are displayed) Labs Reviewed - No data to display  EKG None  Radiology DG Ribs Unilateral W/Chest Left  Result Date: 05/14/2020 CLINICAL DATA:  Pain following fall EXAM: LEFT RIBS AND CHEST - 3+ VIEW COMPARISON:  Chest and ribs June 28, 2017. FINDINGS: Frontal chest as well as oblique and cone-down rib images were obtained. The lungs are clear. The heart size and pulmonary vascularity are normal. No adenopathy. No pneumothorax or pleural effusion. No evident rib fracture. IMPRESSION: No rib fracture.  Lungs clear. Electronically Signed   By: June 30, 2017 III M.D.   On: 05/14/2020 10:48   DG Lumbar Spine Complete  Result Date: 05/14/2020 CLINICAL DATA:  Pain following  fall EXAM: LUMBAR SPINE - COMPLETE 4+ VIEW COMPARISON:  None. FINDINGS: Frontal, lateral, spot lumbosacral lateral, and bilateral oblique views were obtained. There are 4 strictly non-rib-bearing lumbar type vertebral bodies. There are rudimentary ribs at L1 bilaterally. There is no appreciable fracture or spondylolisthesis. Disc spaces appear unremarkable. There is facet osteoarthritic change at L5-S1 on the left and to a lesser extent at L4-5 bilaterally. No erosive change. IMPRESSION: Facet osteoarthritic change at L5-S1 on the left and to a lesser extent at L4-5 bilaterally.  No appreciable disc space narrowing. No fracture or spondylolisthesis. Electronically Signed   By: Bretta Bang III M.D.   On: 05/14/2020 10:50   DG Shoulder Left  Result Date: 05/14/2020 CLINICAL DATA:  Pain fall following fall EXAM: LEFT SHOULDER - 2+ VIEW COMPARISON:  None. FINDINGS: Oblique, Y scapular, and axillary images were obtained. There is slight generalized joint space narrowing. No fracture or dislocation. No erosive change or intra-articular calcification. Visualized left lung clear. IMPRESSION: Mild generalized osteoarthritic change.  No fracture or dislocation. Electronically Signed   By: Bretta Bang III M.D.   On: 05/14/2020 10:51    Procedures Procedures (including critical care time)  Medications Ordered in ED Medications - No data to display  ED Course  I have reviewed the triage vital signs and the nursing notes.  Pertinent labs & imaging results that were available during my care of the patient were reviewed by me and considered in my medical decision making (see chart for details).    MDM Rules/Calculators/A&P                          59 year old female presenting to the ED after mechanical fall that occurred last night.  Reports left lower back pain, left posterior rib pain and left shoulder pain.  Pain at the midline of the lumbar spine paraspinal musculature.  No deformities noted.   No numbness or weakness noted of upper or lower extremities.  She remains ambulatory.  Denies any head injury, loss of consciousness, headache or blurry vision.  No anticoagulant use.  X-rays here of the lumbar spine, ribs and chest and left shoulder without any acute abnormalities.  Suspect that symptoms are due to strain.  We will have her take muscle relaxer, use heat, stretching massaging and follow-up with PCP.  Strict return precautions given.   Patient is hemodynamically stable, in NAD, and able to ambulate in the ED. Evaluation does not show pathology that would require ongoing emergent intervention or inpatient treatment. I explained the diagnosis to the patient. Pain has been managed and has no complaints prior to discharge. Patient is comfortable with above plan and is stable for discharge at this time. All questions were answered prior to disposition. Strict return precautions for returning to the ED were discussed. Encouraged follow up with PCP.   An After Visit Summary was printed and given to the patient.   Portions of this note were generated with Scientist, clinical (histocompatibility and immunogenetics). Dictation errors may occur despite best attempts at proofreading.  Final Clinical Impression(s) / ED Diagnoses Final diagnoses:  Fall in home, initial encounter  Strain of lumbar region, initial encounter    Rx / DC Orders ED Discharge Orders         Ordered    methocarbamol (ROBAXIN) 500 MG tablet  2 times daily        05/14/20 1110           Dietrich Pates, PA-C 05/14/20 1112    Tilden Fossa, MD 05/15/20 [phone]

## 2020-05-14 NOTE — Discharge Instructions (Signed)
Take the Robaxin as needed to help with your symptoms. Use a heating pad, stretch and massage the area to help with symptoms as well. Return to the ER if you start to experience worsening pain, chest pain, shortness of breath, additional injuries, headache or blurry vision.

## 2020-05-14 NOTE — ED Triage Notes (Signed)
Reports fall last night in bedroom , landed on back, obvious bruising to left lower back. Denies loc.

## 2020-12-20 ENCOUNTER — Emergency Department (HOSPITAL_BASED_OUTPATIENT_CLINIC_OR_DEPARTMENT_OTHER)
Admission: EM | Admit: 2020-12-20 | Discharge: 2020-12-20 | Disposition: A | Discharge status: Home / Self Care | Payer: Medicaid Other | Attending: Emergency Medicine | Admitting: Emergency Medicine

## 2020-12-20 ENCOUNTER — Other Ambulatory Visit: Payer: Self-pay

## 2020-12-20 DIAGNOSIS — S161XXA Strain of muscle, fascia and tendon at neck level, initial encounter: Secondary | ICD-10-CM | POA: Insufficient documentation

## 2020-12-20 DIAGNOSIS — Y92410 Unspecified street and highway as the place of occurrence of the external cause: Secondary | ICD-10-CM | POA: Insufficient documentation

## 2020-12-20 DIAGNOSIS — S29012A Strain of muscle and tendon of back wall of thorax, initial encounter: Secondary | ICD-10-CM | POA: Insufficient documentation

## 2020-12-20 DIAGNOSIS — T148XXA Other injury of unspecified body region, initial encounter: Secondary | ICD-10-CM

## 2020-12-20 DIAGNOSIS — J45909 Unspecified asthma, uncomplicated: Secondary | ICD-10-CM | POA: Insufficient documentation

## 2020-12-20 DIAGNOSIS — Z79899 Other long term (current) drug therapy: Secondary | ICD-10-CM | POA: Insufficient documentation

## 2020-12-20 DIAGNOSIS — I509 Heart failure, unspecified: Secondary | ICD-10-CM | POA: Insufficient documentation

## 2020-12-20 DIAGNOSIS — I110 Hypertensive heart disease with heart failure: Secondary | ICD-10-CM | POA: Diagnosis not present

## 2020-12-20 DIAGNOSIS — Z7982 Long term (current) use of aspirin: Secondary | ICD-10-CM | POA: Insufficient documentation

## 2020-12-20 DIAGNOSIS — S199XXA Unspecified injury of neck, initial encounter: Secondary | ICD-10-CM | POA: Diagnosis present

## 2020-12-20 MED ORDER — METHOCARBAMOL 500 MG PO TABS: 500.0000 mg | ORAL_TABLET | Freq: Two times a day (BID) | ORAL | 0 refills | Status: DC | PRN
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
## 2020-12-20 NOTE — Discharge Instructions (Signed)
He can continue take Tylenol as needed for management of your symptoms.  Please follow the instructions on the bottle.  I am also prescribing you a medication called Robaxin.  This is a strong muscle relaxer that you can take up to twice per day.  This medication can be sedating.  Do not drive a motor vehicle after taking this medication.  Do not mix with alcohol.  Please follow-up with your regular doctor regarding your symptoms if they persist.  If they worsen, please return to the emergency department.  It was a pleasure to meet you.

## 2021-03-25 IMAGING — CR DG LUMBAR SPINE COMPLETE 4+V
5 series · 5 of 5 positions shown · non-contrast
Comparison: None.

CLINICAL DATA: Pain following fall

EXAM:
LUMBAR SPINE - COMPLETE 4+ VIEW

[t l-spine a.p.]
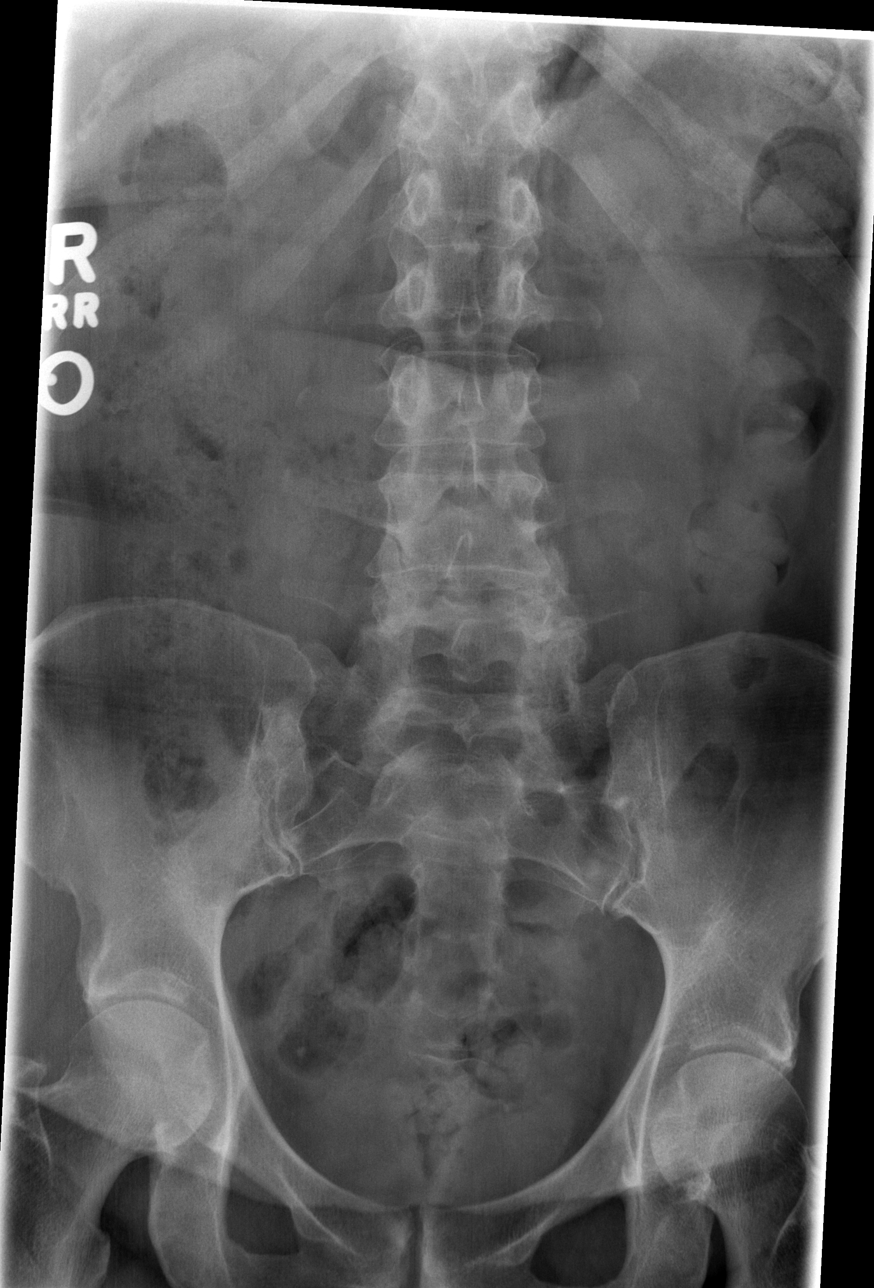

[t l-spine oblique exposure (1 of 2)]
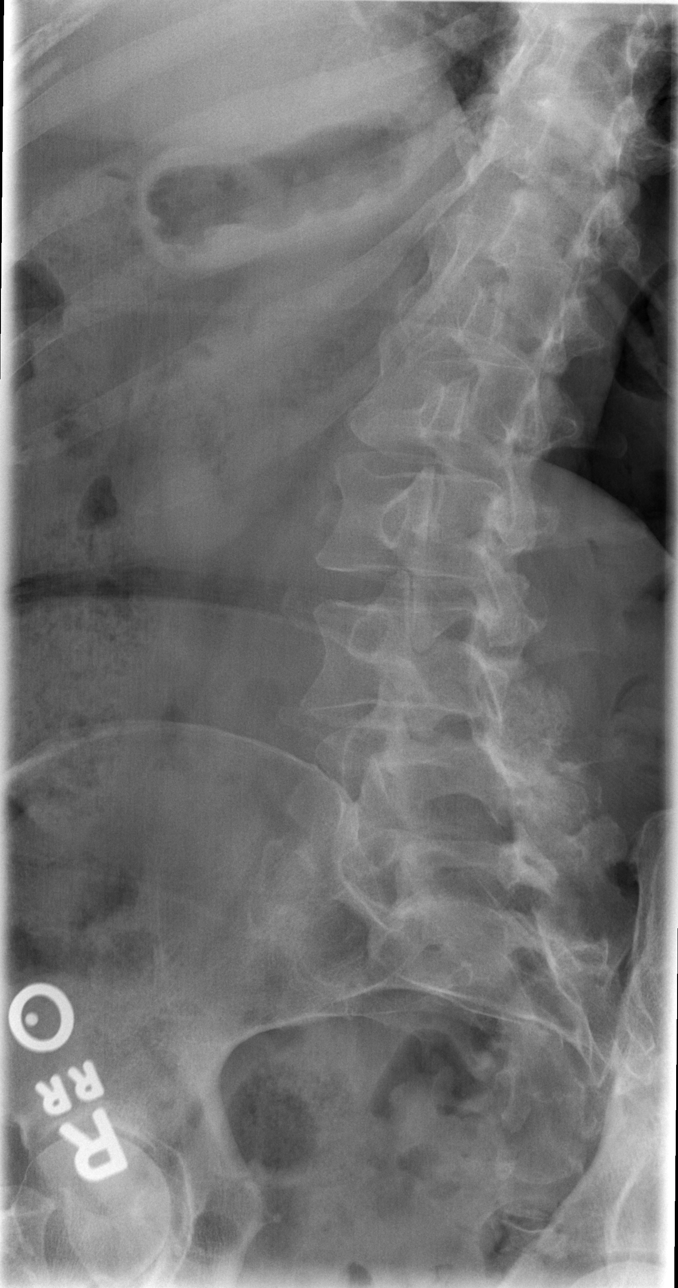

[t l-spine oblique exposure (2 of 2)]
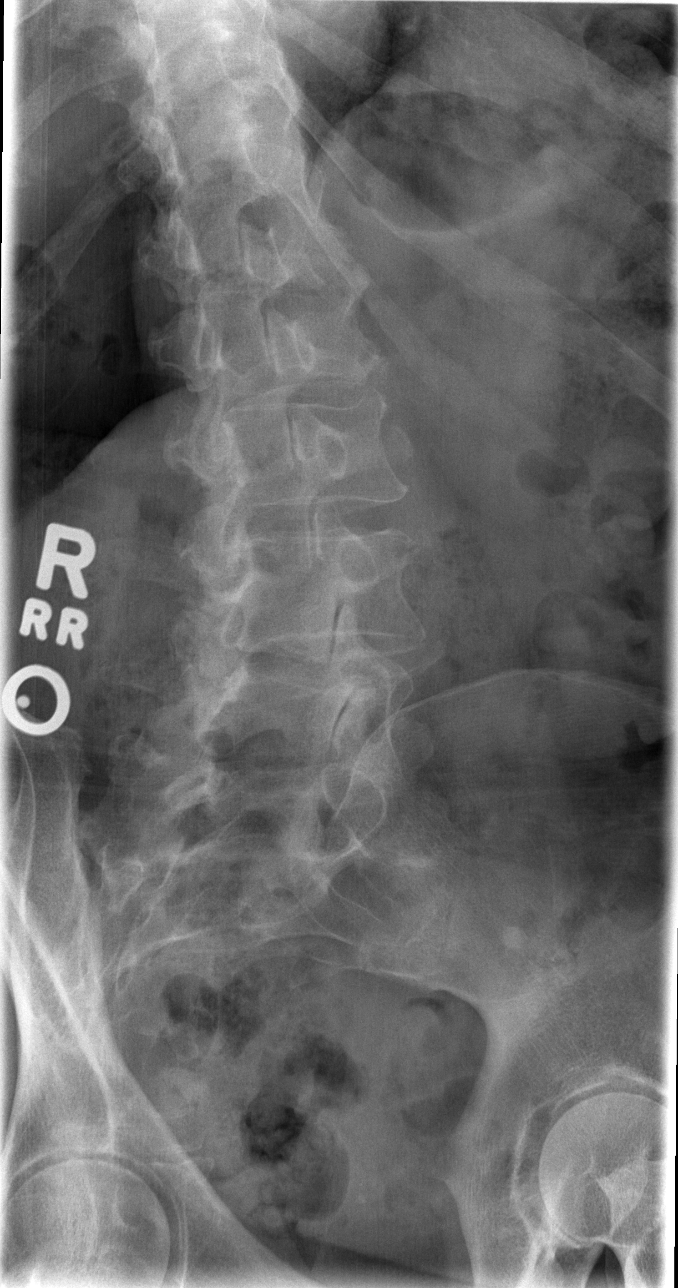

[t l-spine lat]
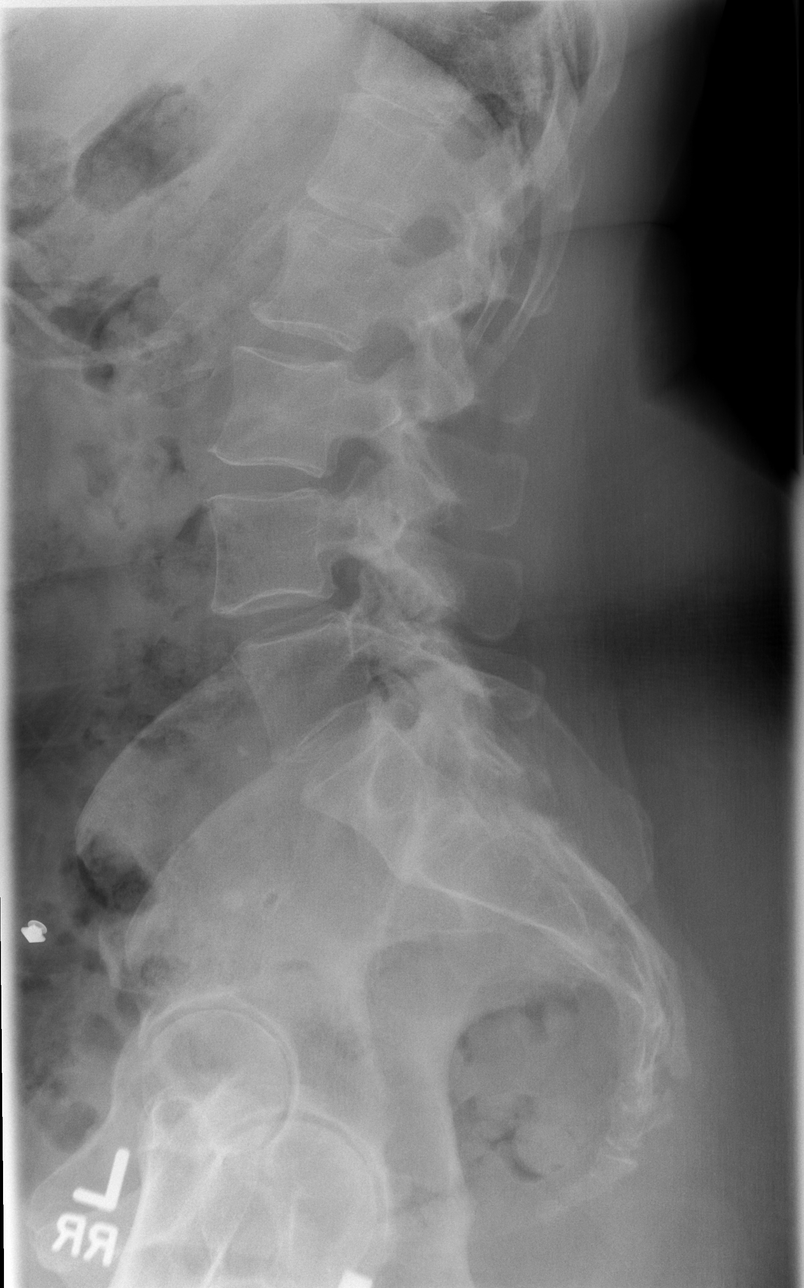

[t l-spine l5-s1 spot]
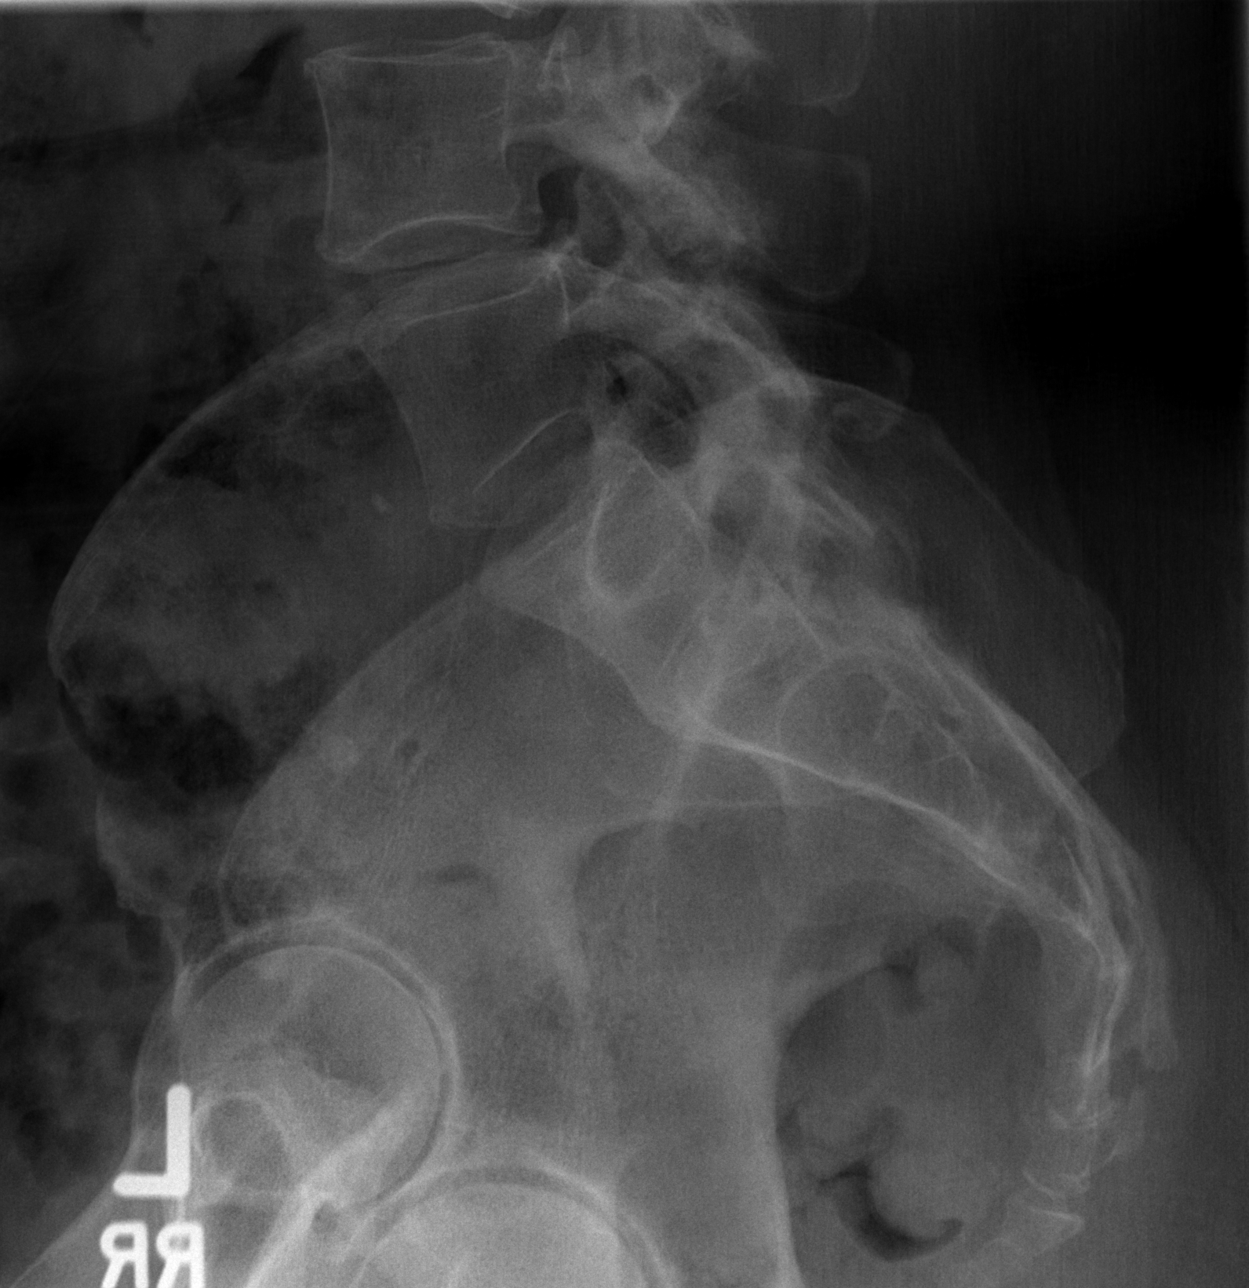

[5 of 5 positions shown; findings below may reference images not displayed]

FINDINGS: Frontal, lateral, spot lumbosacral lateral, and bilateral oblique
views were obtained. There are 4 strictly non-rib-bearing lumbar
type vertebral bodies. There are rudimentary ribs at L1 bilaterally.
There is no appreciable fracture or spondylolisthesis. Disc spaces
appear unremarkable. There is facet osteoarthritic change at L5-S1
on the left and to a lesser extent at L4-5 bilaterally. No erosive
change.
IMPRESSION: Facet osteoarthritic change at L5-S1 on the left and to a lesser
extent at L4-5 bilaterally. No appreciable disc space narrowing. No
fracture or spondylolisthesis.

## 2021-03-25 IMAGING — CR DG SHOULDER 2+V*L*
3 series · 3 of 3 positions shown · non-contrast
Comparison: None.

CLINICAL DATA: Pain fall following fall

EXAM:
LEFT SHOULDER - 2+ VIEW

[w shoulder grashey left *]
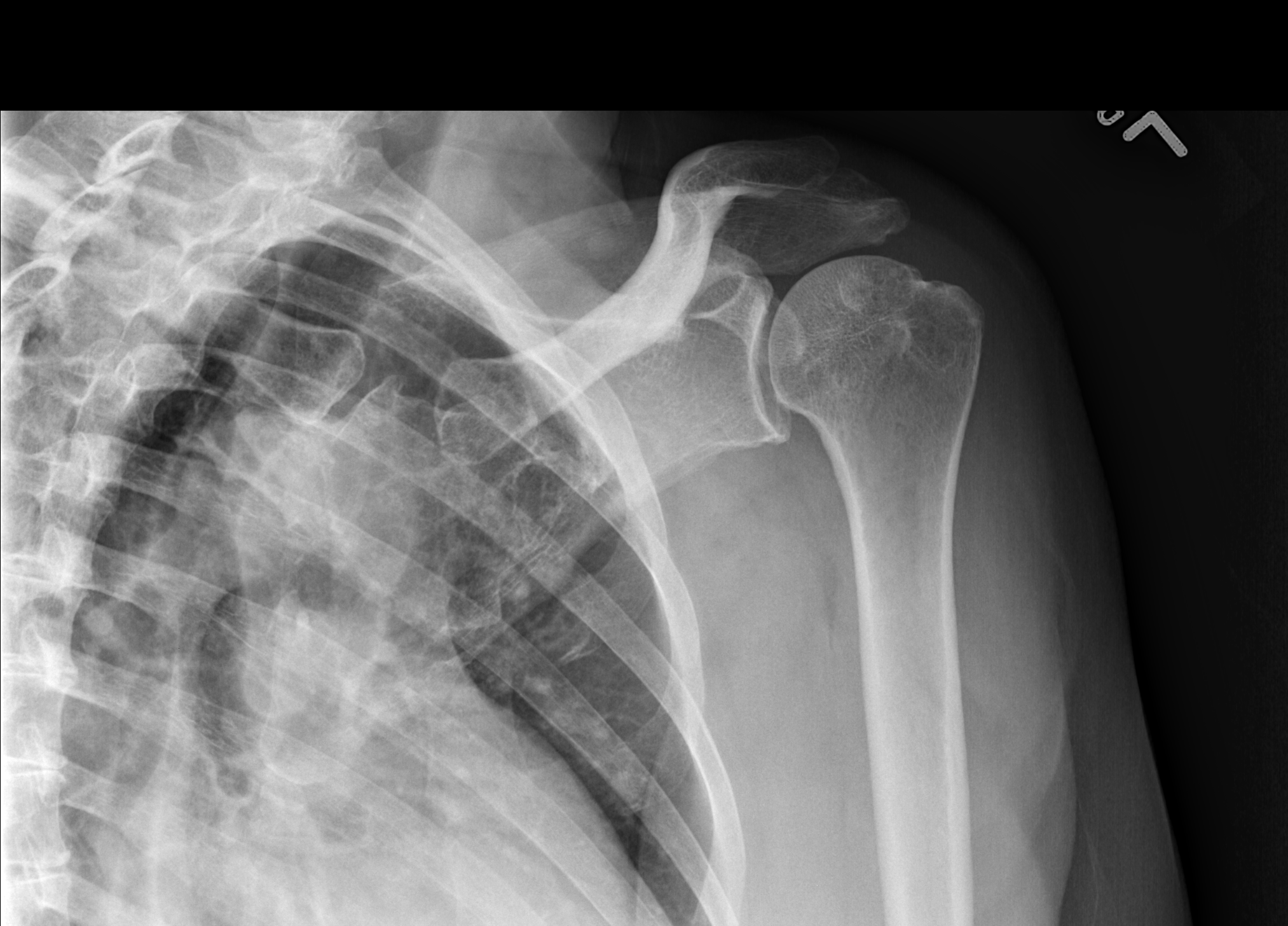

[w shoulder y view left *]
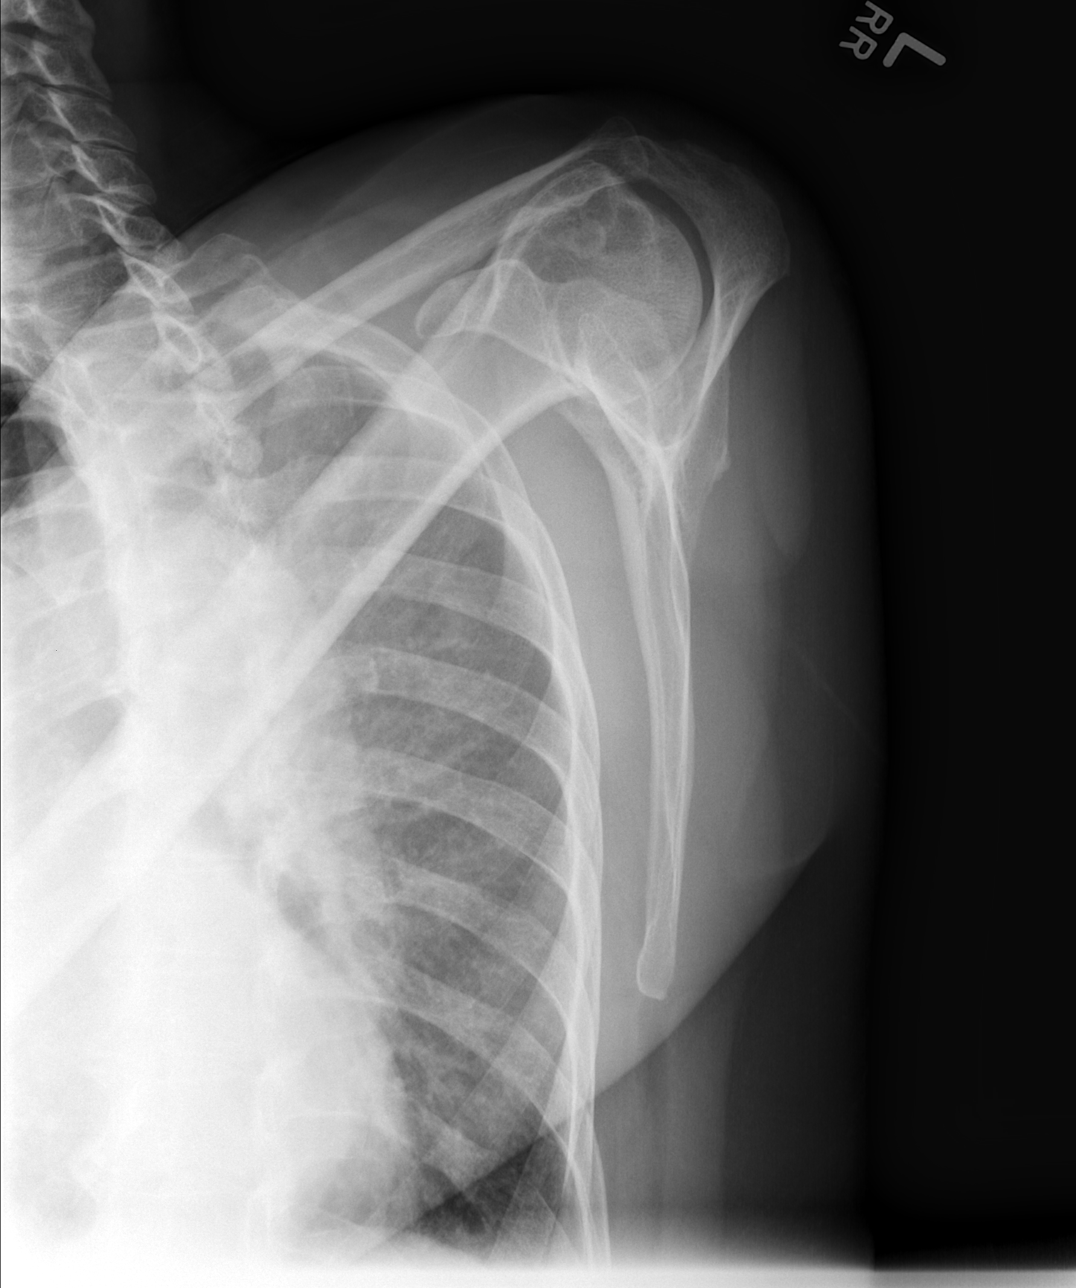

[x shoulder axillary left]
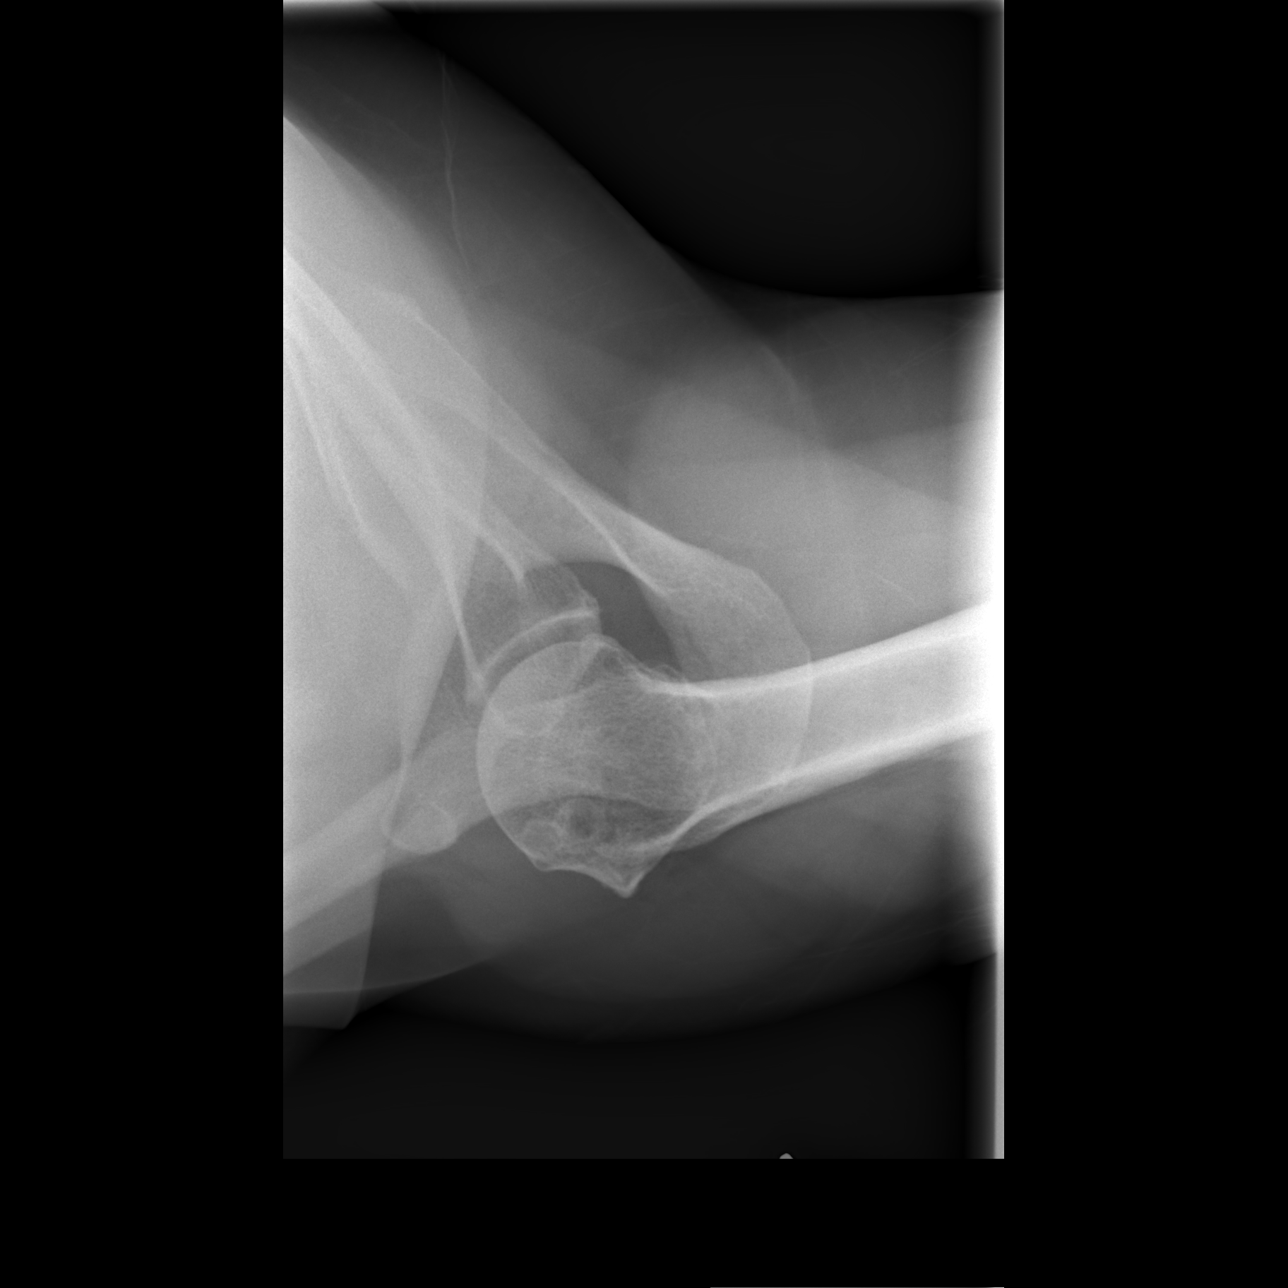

[3 of 3 positions shown; findings below may reference images not displayed]

FINDINGS: Oblique, Y scapular, and axillary images were obtained. There is
slight generalized joint space narrowing. No fracture or
dislocation. No erosive change or intra-articular calcification.
Visualized left lung clear.
IMPRESSION: Mild generalized osteoarthritic change.  No fracture or dislocation.

## 2021-05-09 ENCOUNTER — Encounter (HOSPITAL_BASED_OUTPATIENT_CLINIC_OR_DEPARTMENT_OTHER): Payer: Self-pay | Admitting: Emergency Medicine

## 2021-05-09 ENCOUNTER — Other Ambulatory Visit: Payer: Self-pay

## 2021-05-09 ENCOUNTER — Emergency Department (HOSPITAL_BASED_OUTPATIENT_CLINIC_OR_DEPARTMENT_OTHER)
Admission: EM | Admit: 2021-05-09 | Discharge: 2021-05-09 | Disposition: A | Discharge status: Home / Self Care | Payer: Medicaid Other | Attending: Emergency Medicine | Admitting: Emergency Medicine

## 2021-05-09 DIAGNOSIS — U071 COVID-19: Secondary | ICD-10-CM | POA: Diagnosis not present

## 2021-05-09 DIAGNOSIS — J45909 Unspecified asthma, uncomplicated: Secondary | ICD-10-CM | POA: Insufficient documentation

## 2021-05-09 DIAGNOSIS — I509 Heart failure, unspecified: Secondary | ICD-10-CM | POA: Diagnosis not present

## 2021-05-09 DIAGNOSIS — Z7982 Long term (current) use of aspirin: Secondary | ICD-10-CM | POA: Insufficient documentation

## 2021-05-09 DIAGNOSIS — I110 Hypertensive heart disease with heart failure: Secondary | ICD-10-CM | POA: Insufficient documentation

## 2021-05-09 DIAGNOSIS — J069 Acute upper respiratory infection, unspecified: Secondary | ICD-10-CM

## 2021-05-09 DIAGNOSIS — R059 Cough, unspecified: Secondary | ICD-10-CM | POA: Diagnosis present

## 2021-05-09 NOTE — ED Triage Notes (Addendum)
Pt c/o dry cough x 2 days. Pt reports other family with similar illness recently. Pt had COVID vaccine but no booster.

## 2021-05-09 NOTE — ED Provider Notes (Signed)
[facility] EMERGENCY DEPARTMENT Provider Note   CSN: [id number] Arrival date & time: 05/09/21  1146     History Chief Complaint  Patient presents with   Cough    Sherri Graham is a 60 y.o. female with past medical history significant for asthma, congestive heart failure who presents with 2 to 3 days of upper respiratory symptoms including cough, sore throat, headache.  This patient also reports that she is having some cough that causes tightness in her chest.  She is not having any pain in her chest at rest, or with exertion, she has no radiation of her chest pain.  She is not having any shortness of breath.  She has not had to use her inhaler.  Patient has had a COVID-vaccine but no booster.  Patient has not been tested for COVID.  Patient has not had any fever, has not taken Tylenol.  Patient denies diarrhea.  Patient also endorses some left ear pain for the same duration of time as the rest of her symptoms.   Cough Associated symptoms: sore throat   Associated symptoms: no chest pain, no shortness of breath and no wheezing       Past Medical History:  Diagnosis Date   Asthma    CHF (congestive heart failure) (HCC)    Depression    Hypercholesteremia    Hypertension     Patient Active Problem List   Diagnosis Date Noted   Cough 05/03/2017   Elevated troponin    Essential hypertension    Pure hypercholesterolemia    Cardiomyopathy (HCC) 11/22/2016   Chest wall pain 11/21/2016    Past Surgical History:  Procedure Laterality Date   ABDOMINAL HYSTERECTOMY     KNEE SURGERY     LEFT HEART CATH AND CORONARY ANGIOGRAPHY N/A 05/03/2017   Procedure: LEFT HEART CATH AND CORONARY ANGIOGRAPHY;  Surgeon: Tonny Bollman, MD;  Location: [facility] INVASIVE CV LAB;  Service: Cardiovascular;  Laterality: N/A;   SHOULDER SURGERY     TONSILLECTOMY     TYMPANOSTOMY TUBE PLACEMENT       OB History   No obstetric history on file.     Family History  Problem Relation Age of Onset    Hypertension Mother     Social History   Tobacco Use   Smoking status: Never   Smokeless tobacco: Never  Vaping Use   Vaping Use: Never used  Substance Use Topics   Alcohol use: No   Drug use: No    Home Medications Prior to Admission medications   Medication Sig Start Date End Date Taking? Authorizing Provider  albuterol (PROAIR HFA) 108 (90 Base) MCG/ACT inhaler Inhale 2 puffs into the lungs every 6 (six) hours as needed for wheezing or shortness of breath.    [provider]  ALPRAZolam Prudy Feeler) 1 MG tablet Take 1 mg by mouth 3 (three) times daily as needed for anxiety. 11/08/16   [provider]  aspirin 81 MG tablet Take 81 mg by mouth daily.    [provider]  buPROPion (WELLBUTRIN XL) 300 MG 24 hr tablet Take 300 mg by mouth daily. 04/13/17 04/13/18  [provider]  Cariprazine HCl (VRAYLAR) 1.5 MG CAPS Take 1.5 mg by mouth daily.    [provider]  dextromethorphan 15 MG/5ML syrup Take 10 mLs (30 mg total) by mouth 4 (four) times daily as needed for cough. 05/03/17   Arty Baumgartner, NP  methocarbamol (ROBAXIN) 500 MG tablet Take 1 tablet (500 mg  total) by mouth 2 (two) times daily as needed for muscle spasms. 12/20/20   Placido Sou, PA-C  pravastatin (PRAVACHOL) 10 MG tablet TAKE 1 TABLET(10 MG) BY MOUTH DAILY 06/15/18   Marykay Lex, MD  prazosin (MINIPRESS) 2 MG capsule Take 2 mg by mouth at bedtime.     [provider]    Allergies    Penicillins  Review of Systems   Review of Systems  HENT:  Positive for sore throat.   Respiratory:  Positive for cough and chest tightness. Negative for shortness of breath and wheezing.   Cardiovascular:  Negative for chest pain.  All other systems reviewed and are negative.  Physical Exam Updated Vital Signs BP (!) 142/125 (BP Location: Left Arm)   Pulse 96   Temp 98.9 F (37.2 C) (Oral)   Resp 16   Ht 5\' 2"  (1.575 m)   Wt 77.2 kg   SpO2 99%   BMI 31.13 kg/m    Physical Exam Vitals and nursing note reviewed.  Constitutional:      General: She is not in acute distress.    Appearance: Normal appearance.  HENT:     Head: Normocephalic and atraumatic.     Right Ear: Tympanic membrane and ear canal normal.     Left Ear: Tympanic membrane and ear canal normal.     Nose: No congestion.     Mouth/Throat:     Mouth: Mucous membranes are moist.     Pharynx: No oropharyngeal exudate.  Eyes:     General:        Right eye: No discharge.        Left eye: No discharge.  Cardiovascular:     Rate and Rhythm: Normal rate and regular rhythm.     Heart sounds: No murmur heard.   No friction rub. No gallop.  Pulmonary:     Effort: Pulmonary effort is normal. No respiratory distress.     Breath sounds: No wheezing.  Abdominal:     Palpations: Abdomen is soft.     Tenderness: There is no abdominal tenderness.  Musculoskeletal:        General: No deformity.  Skin:    General: Skin is warm and dry.  Neurological:     Mental Status: She is alert and oriented to person, place, and time.  Psychiatric:        Mood and Affect: Mood normal.        Behavior: Behavior normal.    ED Results / Procedures / Treatments   Labs (all labs ordered are listed, but only abnormal results are displayed) Labs Reviewed  SARS CORONAVIRUS 2 (TAT 6-24 HRS)    EKG None  Radiology No results found.  Procedures Procedures   Medications Ordered in ED Medications - No data to display  ED Course  I have reviewed the triage vital signs and the nursing notes.  Pertinent labs & imaging results that were available during my care of the patient were reviewed by me and considered in my medical decision making (see chart for details).    MDM Rules/Calculators/A&P                         Benign physical exam, no erythema or bulging tympanic membrane of the left ear.  No exudate in the throat.  Patient is in no acute distress, no wheezing with respiration.  Normal  heart rate and rhythm.  No signs of congestive heart failure exacerbation  no pedal edema, or rales to auscultation.  Favor simple upper respiratory infection, will test for COVID-19.  We will provide a work note should the test come back positive.  Patient encouraged to return if her symptoms worsen including worsening shortness of breath, chest pain, intractable fever Final Clinical Impression(s) / ED Diagnoses Final diagnoses:  None    Rx / DC Orders ED Discharge Orders     None        Olene Floss, PA-C 05/09/21 1239    Virgina Norfolk, DO 05/09/21 1529

## 2021-05-09 NOTE — Discharge Instructions (Addendum)
As we discussed you appear to have some sort of upper respiratory infection I suspect potentially that it could be COVID.  We will test you for COVID you can check your results on your patient portal.  If you have test that come back positive should stay out of work for the next 5 days.

## 2021-05-09 NOTE — ED Notes (Signed)
ED Provider at bedside. 

## 2021-05-10 LAB — SARS CORONAVIRUS 2 (TAT 6-24 HRS): SARS Coronavirus 2: POSITIVE — AB

## 2023-07-31 LAB — COLOGUARD: COLOGUARD: NEGATIVE

## 2023-10-21 ENCOUNTER — Emergency Department (HOSPITAL_BASED_OUTPATIENT_CLINIC_OR_DEPARTMENT_OTHER): Payer: Medicaid Other

## 2023-10-21 ENCOUNTER — Emergency Department (HOSPITAL_BASED_OUTPATIENT_CLINIC_OR_DEPARTMENT_OTHER)
Admission: EM | Admit: 2023-10-21 | Discharge: 2023-10-21 | Disposition: A | Discharge status: Home / Self Care | Payer: Medicaid Other | Attending: Emergency Medicine | Admitting: Emergency Medicine

## 2023-10-21 ENCOUNTER — Encounter (HOSPITAL_BASED_OUTPATIENT_CLINIC_OR_DEPARTMENT_OTHER): Payer: Self-pay | Admitting: Emergency Medicine

## 2023-10-21 ENCOUNTER — Other Ambulatory Visit: Payer: Self-pay

## 2023-10-21 DIAGNOSIS — I509 Heart failure, unspecified: Secondary | ICD-10-CM | POA: Insufficient documentation

## 2023-10-21 DIAGNOSIS — I110 Hypertensive heart disease with heart failure: Secondary | ICD-10-CM | POA: Insufficient documentation

## 2023-10-21 DIAGNOSIS — M25552 Pain in left hip: Secondary | ICD-10-CM | POA: Diagnosis present

## 2023-10-21 DIAGNOSIS — E119 Type 2 diabetes mellitus without complications: Secondary | ICD-10-CM | POA: Diagnosis not present

## 2023-10-21 DIAGNOSIS — Z79899 Other long term (current) drug therapy: Secondary | ICD-10-CM | POA: Insufficient documentation

## 2023-10-21 DIAGNOSIS — Z7951 Long term (current) use of inhaled steroids: Secondary | ICD-10-CM | POA: Diagnosis not present

## 2023-10-21 DIAGNOSIS — W19XXXA Unspecified fall, initial encounter: Secondary | ICD-10-CM

## 2023-10-21 DIAGNOSIS — W010XXA Fall on same level from slipping, tripping and stumbling without subsequent striking against object, initial encounter: Secondary | ICD-10-CM | POA: Insufficient documentation

## 2023-10-21 DIAGNOSIS — Z7982 Long term (current) use of aspirin: Secondary | ICD-10-CM | POA: Diagnosis not present

## 2023-10-21 DIAGNOSIS — J45909 Unspecified asthma, uncomplicated: Secondary | ICD-10-CM | POA: Insufficient documentation

## 2023-10-21 HISTORY — DX: Type 2 diabetes mellitus without complications: E11.9

## 2023-10-21 HISTORY — DX: Aneurysm of unspecified site: I72.9

## 2023-10-21 HISTORY — DX: Post-traumatic stress disorder, unspecified: F43.10

## 2023-10-21 MED ORDER — METHOCARBAMOL 500 MG PO TABS: 500.0000 mg | ORAL_TABLET | Freq: Two times a day (BID) | ORAL | 0 refills | Status: AC | PRN

## 2023-10-21 MED ORDER — KETOROLAC TROMETHAMINE 30 MG/ML IJ SOLN
30.0000 mg | Freq: Once | INTRAMUSCULAR | Status: AC
  Administered 2023-10-21: 30 mg via INTRAMUSCULAR
  Filled 2023-10-21: qty 1

## 2023-10-21 MED ORDER — MELOXICAM 15 MG PO TABS: 15.0000 mg | ORAL_TABLET | Freq: Every day | ORAL | 0 refills | Status: AC | PRN

## 2023-10-21 NOTE — ED Notes (Signed)
 Fall risk armband Fall risk socks Fall risk sign on door

## 2023-10-21 NOTE — ED Provider Notes (Signed)
 [place] [facility] Provider Note   CSN: [id number] Arrival date & time: 10/21/23  0865     History  Chief Complaint  Patient presents with   Sherri Graham    Sherri Graham is a 63 y.o. female.   Fall  63 year old female presents emergency department complaints of left-sided hip/thigh pain.  Patient states that she was walking yesterday afternoon around 4 to 5 PM and states that she slipped on a patch of frozen ground.  Reports landing on her left side.  Has had pain since then.  Has had pain with bearing any weight on her left extremity and states she is been mainly scooting around at home.  Denies any trauma to head, LOC, blood thinner use, chest pain, shortness of breath, abdominal pain, nausea, vomiting.  Has taken Tylenol for the pain with some improvement of symptoms.  Presents emergency department for further assessment/evaluation.   Past medical history significant for CHF, diabetes mellitus, hypertension, hypercholesterolemia, asthma, cardiomyopathy  Home Medications Prior to Admission medications   Medication Sig Start Date End Date Taking? Authorizing Provider  meloxicam (MOBIC) 15 MG tablet Take 1 tablet (15 mg total) by mouth daily as needed for pain. 10/21/23  Yes Sherian Maroon A, PA  methocarbamol (ROBAXIN) 500 MG tablet Take 1 tablet (500 mg total) by mouth 2 (two) times daily as needed for muscle spasms. 10/21/23  Yes Sherian Maroon A, PA  albuterol (PROAIR HFA) 108 (90 Base) MCG/ACT inhaler Inhale 2 puffs into the lungs every 6 (six) hours as needed for wheezing or shortness of breath.    [provider]  ALPRAZolam Prudy Feeler) 1 MG tablet Take 1 mg by mouth 3 (three) times daily as needed for anxiety. 11/08/16   [provider]  aspirin 81 MG tablet Take 81 mg by mouth daily.    [provider]  buPROPion (WELLBUTRIN XL) 300 MG 24 hr tablet Take 300 mg by mouth daily. 04/13/17 04/13/18  [provider]   Cariprazine HCl (VRAYLAR) 1.5 MG CAPS Take 1.5 mg by mouth daily.    [provider]  pravastatin (PRAVACHOL) 10 MG tablet TAKE 1 TABLET(10 MG) BY MOUTH DAILY 06/15/18   Marykay Lex, MD  prazosin (MINIPRESS) 2 MG capsule Take 2 mg by mouth at bedtime.     [provider]      Allergies    Penicillins    Review of Systems   Review of Systems  All other systems reviewed and are negative.   Physical Exam Updated Vital Signs BP (!) 130/94 (BP Location: Right Arm)   Pulse 69   Temp 97.6 F (36.4 C) (Oral)   Resp 18   Ht 5\' 2"  (1.575 m)   Wt 77.1 kg   SpO2 100%   BMI 31.09 kg/m  Physical Exam Vitals and nursing note reviewed.  Constitutional:      General: She is not in acute distress.    Appearance: She is well-developed.  HENT:     Head: Normocephalic and atraumatic.  Eyes:     Conjunctiva/sclera: Conjunctivae normal.  Cardiovascular:     Rate and Rhythm: Normal rate and regular rhythm.  Pulmonary:     Effort: Pulmonary effort is normal. No respiratory distress.     Breath sounds: Normal breath sounds.  Abdominal:     Palpations: Abdomen is soft.     Tenderness: There is no abdominal tenderness.  Musculoskeletal:        General: No swelling.  Cervical back: Neck supple.     Right lower leg: No edema.     Left lower leg: No edema.     Comments: Full range of motion bilateral upper/lower extremitie.  No overlying tenderness of upper extremities.  Tender palpation left hip as well as posterior thigh, otherwise no tenderness bilateral lower extremities.  Pedal and radial pulses 2+ bilaterally.  Motor no midline tenderness cervical, thoracic, lumbar spine without step-off deformity.  No chest wall tenderness.  Skin:    General: Skin is warm and dry.     Capillary Refill: Capillary refill takes less than 2 seconds.  Neurological:     Mental Status: She is alert.  Psychiatric:        Mood and Affect: Mood normal.     ED Results / Procedures /  Treatments   Labs (all labs ordered are listed, but only abnormal results are displayed) Labs Reviewed - No data to display  EKG None  Radiology CT Hip Left Wo Contrast Result Date: 10/21/2023 CLINICAL DATA:  Fall yesterday. Severe left hip pain. Evaluate for occult fracture. EXAM: CT OF THE LEFT HIP WITHOUT CONTRAST TECHNIQUE: Multidetector CT imaging of the left hip was performed according to the standard protocol. Multiplanar CT image reconstructions were also generated. RADIATION DOSE REDUCTION: This exam was performed according to the departmental dose-optimization program which includes automated exposure control, adjustment of the mA and/or kV according to patient size and/or use of iterative reconstruction technique. COMPARISON:  None Available. FINDINGS: No evidence of left hip fracture. No evidence of left hip arthropathy or other focal bone lesions. No soft tissue hematoma seen in area of left hip or visualized portion of pelvis. IMPRESSION: No evidence of left hip fracture or other acute findings. Electronically Signed   By: Danae Orleans M.D.   On: 10/21/2023 10:41   DG Hip Unilat W or Wo Pelvis 2-3 Views Left Result Date: 10/21/2023 CLINICAL DATA:  Left hip pain after fall yesterday. EXAM: DG HIP (WITH OR WITHOUT PELVIS) 2-3V LEFT COMPARISON:  None Available. FINDINGS: There is no evidence of hip fracture or dislocation. There is no evidence of arthropathy or other focal bone abnormality. IMPRESSION: Negative. Electronically Signed   By: Lupita Raider M.D.   On: 10/21/2023 09:58   DG Knee Complete 4 Views Left Result Date: 10/21/2023 CLINICAL DATA:  Left knee pain after fall yesterday. EXAM: LEFT KNEE - COMPLETE 4+ VIEW COMPARISON:  None Available. FINDINGS: No evidence of fracture, dislocation, or joint effusion. Minimal narrowing of medial joint space is noted. Mild patellar spurring. Soft tissues are unremarkable. IMPRESSION: No acute abnormality seen. Electronically Signed   By:  Lupita Raider M.D.   On: 10/21/2023 09:56    Procedures Procedures    Medications Ordered in ED Medications  ketorolac (TORADOL) 30 MG/ML injection 30 mg (30 mg Intramuscular Given 10/21/23 0936)    ED Course/ Medical Decision Making/ A&P Clinical Course as of 10/21/23 1050  Sat Oct 21, 2023  1006 Patient attempted to ambulate in the room.  Was able to bear weight on her left side but with significant notable limp.  Was unable to walk 3-4 steps unassisted due to left hip/buttock pain.  Will obtain CT scan. [CR]    Clinical Course User Index [CR] Peter Garter, PA                                 Medical  Decision Making Amount and/or Complexity of Data Reviewed Radiology: ordered.  Risk Prescription drug management.   This patient presents to the ED for concern of fall, this involves an extensive number of treatment options, and is a complaint that carries with it a high risk of complications and morbidity.  The differential diagnosis includes CVA, fracture, strain/pain, dislocation, ligamentous/tendinous injury, neurovascular compromise, other   Co morbidities that complicate the patient evaluation  See HPI   Additional history obtained:  Additional history obtained from EMR External records from outside source obtained and reviewed including hospital records   Lab Tests:  N/a   Imaging Studies ordered:  I ordered imaging studies including pelvis x-ray with left hip, left knee x-ray I independently visualized and interpreted imaging which showed  Pelvis x-ray with left hip: No acute osseous abnormality Left knee x-ray: No acute osseous abnormality. CT left hip: No acute osseous abnormality I agree with the radiologist interpretation   Cardiac Monitoring: / EKG:  The patient was maintained on a cardiac monitor.  I personally viewed and interpreted the cardiac monitored which showed an underlying rhythm of: Sinus rhythm   Consultations  Obtained:  N/a   Problem List / ED Course / Critical interventions / Medication management  Fall I ordered medication including Toradol   Reevaluation of the patient after these medicines showed that the patient improved I have reviewed the patients home medicines and have made adjustments as needed   Social Determinants of Health:  Marijuana use.  Denies other substance use.   Test / Admission - Considered:  Fall Vitals signs within normal range and stable throughout visit. Imaging studies significant for: See above 63 year old female presents emergency department after mechanical fall that occurred yesterday.  Patient was walking and slipped and landed on her left hip.  No trauma to head, LOC.  On exam, tenderness left buttock/hip as well as along hamstring muscles on left side.  Otherwise, no appreciable traumatic injury on exam or reported by patient.  No evidence of pulse deficits on affected extremity concerning for ischemic limb.  X-ray imaging as well as subsequently CT imaging obtained of patient's left hip without evidence of acute osseous abnormality.  Suspect the patient's symptoms are likely secondary to muscular injury.  Will recommend rest, ice, elevation, compression and follow-up with PCP/orthopedics in the outpatient setting.  Treatment plan discussed at length with patient and she acknowledged understanding was agreeable to said plan.  Patient overall well-appearing, afebrile in no acute distress, ambulating with assistance of crutches without difficulty. Worrisome signs and symptoms were discussed with the patient, and the patient acknowledged understanding to return to the ED if noticed. Patient was stable upon discharge.          Final Clinical Impression(s) / ED Diagnoses Final diagnoses:  Fall, initial encounter  Left hip pain    Rx / DC Orders      Peter Garter, PA 10/21/23 1050    Alvira Monday, MD 10/22/23 [phone]

## 2023-10-21 NOTE — Discharge Instructions (Signed)
 As discussed, your workup today was overall reassuring.  The CT scan of your left hip appeared normal.  Does not appear that you broke your pelvis or your hip.  Suspect your symptoms are likely secondary to muscular injury.  Will send home with anti-inflammatories to take as needed as well as muscle relaxer.  Note the muscle laxer can cause drowsiness/confusion so please do not drive or perform any high risk activity and to utilize its effects on you.  Recommend follow-up with your primary care for reassessment.  Please do not hesitate to return if the worrisome signs and symptoms we discussed become apparent.

## 2023-10-21 NOTE — ED Triage Notes (Signed)
 Larey Seat yesterrday trying to get into car, on mud. Landed on left side. No LOC. Pain to post left thigh, hip, pelvis
